# Patient Record
Sex: Female | Born: 1970 | Race: Black or African American | Hispanic: No | Marital: Married | Smoking: Former smoker
Health system: Southern US, Community
[De-identification: ages and names within clinical notes are randomized; demographics above are authoritative.]

## PROBLEM LIST (undated history)

## (undated) DIAGNOSIS — D649 Anemia, unspecified: Secondary | ICD-10-CM

## (undated) DIAGNOSIS — J189 Pneumonia, unspecified organism: Secondary | ICD-10-CM

## (undated) DIAGNOSIS — E119 Type 2 diabetes mellitus without complications: Secondary | ICD-10-CM

---

## 2001-03-29 ENCOUNTER — Emergency Department (HOSPITAL_COMMUNITY): Admission: EM | Admit: 2001-03-29 | Discharge: 2001-03-29 | Payer: Self-pay | Admitting: Emergency Medicine

## 2005-12-26 ENCOUNTER — Emergency Department: Payer: Self-pay | Admitting: Emergency Medicine

## 2007-01-13 ENCOUNTER — Ambulatory Visit: Payer: Self-pay | Admitting: Family Medicine

## 2007-01-22 ENCOUNTER — Ambulatory Visit: Payer: Self-pay | Admitting: Family Medicine

## 2007-01-22 LAB — CONVERTED CEMR LAB
ALT: 21 units/L (ref 0–40)
AST: 26 units/L (ref 0–37)
Alkaline Phosphatase: 89 units/L (ref 39–117)
BUN: 10 mg/dL (ref 6–23)
Calcium: 8.8 mg/dL (ref 8.4–10.5)
Cholesterol: 186 mg/dL (ref 0–200)
Creatinine,U: 133.8 mg/dL
GFR calc Af Amer: 145 mL/min
GFR calc non Af Amer: 120 mL/min
Glucose, Bld: 309 mg/dL — ABNORMAL HIGH (ref 70–99)
Hgb A1c MFr Bld: 12 % — ABNORMAL HIGH (ref 4.6–6.0)
Potassium: 3.7 meq/L (ref 3.5–5.1)
Total Protein: 6.3 g/dL (ref 6.0–8.3)

## 2007-01-23 ENCOUNTER — Encounter: Payer: Self-pay | Admitting: Family Medicine

## 2007-02-13 ENCOUNTER — Encounter: Payer: Self-pay | Admitting: Family Medicine

## 2007-02-14 ENCOUNTER — Encounter: Payer: Self-pay | Admitting: Family Medicine

## 2007-02-14 LAB — CONVERTED CEMR LAB: Pap Smear: NORMAL

## 2007-02-16 ENCOUNTER — Ambulatory Visit: Payer: Self-pay | Admitting: Family Medicine

## 2007-03-02 ENCOUNTER — Encounter: Payer: Self-pay | Admitting: Family Medicine

## 2007-03-02 ENCOUNTER — Other Ambulatory Visit: Admission: RE | Admit: 2007-03-02 | Discharge: 2007-03-02 | Payer: Self-pay | Admitting: Family Medicine

## 2007-03-02 ENCOUNTER — Ambulatory Visit: Payer: Self-pay | Admitting: Family Medicine

## 2007-03-26 ENCOUNTER — Emergency Department: Payer: Self-pay | Admitting: Emergency Medicine

## 2007-10-14 ENCOUNTER — Encounter: Payer: Self-pay | Admitting: Family Medicine

## 2007-11-23 ENCOUNTER — Ambulatory Visit: Payer: Self-pay | Admitting: Family Medicine

## 2007-11-23 DIAGNOSIS — B373 Candidiasis of vulva and vagina: Secondary | ICD-10-CM

## 2007-11-23 DIAGNOSIS — L538 Other specified erythematous conditions: Secondary | ICD-10-CM | POA: Insufficient documentation

## 2007-11-23 LAB — CONVERTED CEMR LAB
Bilirubin Urine: NEGATIVE
Ketones, urine, test strip: NEGATIVE
Nitrite: NEGATIVE
Specific Gravity, Urine: 1.015
Urobilinogen, UA: NEGATIVE

## 2007-11-24 DIAGNOSIS — E119 Type 2 diabetes mellitus without complications: Secondary | ICD-10-CM

## 2007-12-21 ENCOUNTER — Encounter (INDEPENDENT_AMBULATORY_CARE_PROVIDER_SITE_OTHER): Payer: Self-pay | Admitting: *Deleted

## 2007-12-23 ENCOUNTER — Encounter (INDEPENDENT_AMBULATORY_CARE_PROVIDER_SITE_OTHER): Payer: Self-pay | Admitting: *Deleted

## 2008-03-31 ENCOUNTER — Ambulatory Visit: Payer: Self-pay | Admitting: Family Medicine

## 2008-03-31 DIAGNOSIS — N3000 Acute cystitis without hematuria: Secondary | ICD-10-CM | POA: Insufficient documentation

## 2008-03-31 LAB — CONVERTED CEMR LAB
Bilirubin Urine: NEGATIVE
Glucose, Urine, Semiquant: >=1000
Urobilinogen, UA: NEGATIVE

## 2008-04-01 ENCOUNTER — Encounter: Payer: Self-pay | Admitting: Family Medicine

## 2008-04-01 DIAGNOSIS — E669 Obesity, unspecified: Secondary | ICD-10-CM | POA: Insufficient documentation

## 2008-04-01 DIAGNOSIS — R87619 Unspecified abnormal cytological findings in specimens from cervix uteri: Secondary | ICD-10-CM

## 2008-04-01 DIAGNOSIS — A599 Trichomoniasis, unspecified: Secondary | ICD-10-CM

## 2009-07-01 ENCOUNTER — Emergency Department: Payer: Self-pay | Admitting: Emergency Medicine

## 2010-05-18 ENCOUNTER — Emergency Department: Payer: Self-pay | Admitting: Emergency Medicine

## 2010-10-09 ENCOUNTER — Ambulatory Visit: Payer: Self-pay | Admitting: Family Medicine

## 2011-05-03 NOTE — Assessment & Plan Note (Signed)
[facility]                           [facility] OFFICE NOTE   NAME:[name], [name] CREASON                       MRN:          [id number]  DATE:01/13/2007                            DOB:          1971/02/25    CHIEF COMPLAINT:  A 40 year old black female here to establish new  doctor.   HISTORY OF PRESENT ILLNESS:  Ms. [name] [name] has not seen a primary care  doctor since she saw Dr. Welton Flakes 2-3 years ago.  She has the following  concerns:  1. Diabetes, poor control:  She states that she has not had this      evaluated but did have some blood sugar spillage in her urine when      she [facility].  She has      been having increased thirst and fatigue.  She states that she eats      very well on a diabetic diet but drinks lots of soda and Kool-Aid      and knows that that is her major issue.  She does not measure her      blood sugar at home.  She does not get regular exercise.  2. Vaginal itching, chronic:  She states that she notices intermittent      worsening of vaginal itching off and on when she eats lots of      starches.  She states that is recurrent, several times a month.      She has used some over-the-counter Monistat recently which helped a      small amount but did not make it go away completely.  She denies      copious vaginal discharge.  She occasionally does have some redness      and irritation under her breasts.   PAST MEDICAL HISTORY:  1. Diabetes.  2. Abnormal Pap.  3. Trichomonas.   HOSPITALIZATIONS, SURGERIES, PROCEDURES:  1. In 1992, C-section.  2. Cryotherapy.  3. Pap smear in 2006 negative.   ALLERGIES:  None.   MEDICATIONS:  None.   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  She does not smoke.  She rarely drinks alcohol, about one time every few  months.  She does not use drugs.  She is single but is currently in a  longterm relationship and does not use  condoms.  She has one child who  is healthy.  She walks at work but does not get regular exercise.  She  eats three meals a day.  She eats lot of vegetables, lean meats, and  does not like starches so avoids them.  She also does not eat frequent  fast food.  She does drink a lot of Kool-Aid and soda throughout the  day, as well as a lot of water.   FAMILY HISTORY:  Father deceased at age 27 from MI and diabetes.  Mother  alive at age 71 with diabetes, on dialysis with kidney failure, as well  as blindness.  She has one half-brother and two  half-sisters who are  healthy.  She has an aunt who developed breast cancer at age 53 or 43,  but no other family history of cancer.   PHYSICAL EXAMINATION:  VITAL SIGNS:  Height 67-and-a-quarter inches,  weight 290, making BMI 39.  Blood pressure 126/84, pulse 84, temperature  98.3.  GENERAL:  Obese-appearing female in no apparent distress.  HEENT:  PERRLA, extraocular muscles intact.  Oropharynx clear, tympanic  membranes clear, nares clear.  No thyromegaly.  No lymphadenopathy  supraclavicular or cervical.  CARDIOVASCULAR:  Regular rate and rhythm.  No murmurs, rubs or gallops.  PULMONARY:  Clear to auscultation bilaterally.  No wheezes, rales, or  rhonchi.  ABDOMEN:  Soft, nontender, normoactive bowel sounds, no  hepatosplenomegaly.  MUSCULOSKELETAL:  Strength 5/5 in upper and lower extremities.  NEUROLOGIC:  Alert and oriented x3.  Cranial nerves II-XII grossly  intact.  GENITOURINARY:  Dry, irritated vaginal vault with dried discharge.  No  cervical motion tenderness.   PROCEDURE:  Wet prep:  Positive clue cells, no Trichomonas, no yeast.   ASSESSMENT AND PLAN:  1. Diabetes, unclear or poor control:  We will begin by checking      hemoglobin A1c, CMET, cholesterol, and microalbumin.  A lot of her      symptoms are most likely secondary to poorly controlled diabetes.      Depending on her hemoglobin A1c, we will have her work on diet and       exercise and lifestyle changes versus adding a medication.  She was      set up for diabetes and nutrition referral today.  She was also      given lots of information on lifestyle change and complications of      diabetes.  2. Bacterial vaginosis:  Will treat with Flagyl 500 mg p.o. b.i.d. x7      days.  This has likely been a repeated problem along with possible      yeast infections because of poorly controlled diabetes.  3. Prevention:  As stated above, I will get a cholesterol panel.  She      is up-to-date with vaccines.  She is due for a mammogram, given her      aunt with breast cancer at age 34.  She will return for Pap smear.      I will obtain records from her previous doctor.     Kerby Nora, MD  Electronically Signed    AB/MedQ  DD: 01/13/2007  DT: 01/13/2007  Job #: [id number]

## 2017-03-05 ENCOUNTER — Ambulatory Visit: Payer: Self-pay | Attending: Oncology

## 2018-02-25 ENCOUNTER — Emergency Department: Payer: No Typology Code available for payment source

## 2018-02-25 ENCOUNTER — Emergency Department
Admission: EM | Admit: 2018-02-25 | Discharge: 2018-02-25 | Disposition: A | Discharge status: Home / Self Care | Payer: No Typology Code available for payment source | Attending: Emergency Medicine | Admitting: Emergency Medicine

## 2018-02-25 ENCOUNTER — Encounter: Payer: Self-pay | Admitting: Emergency Medicine

## 2018-02-25 DIAGNOSIS — M7918 Myalgia, other site: Secondary | ICD-10-CM

## 2018-02-25 DIAGNOSIS — Y92410 Unspecified street and highway as the place of occurrence of the external cause: Secondary | ICD-10-CM | POA: Insufficient documentation

## 2018-02-25 DIAGNOSIS — S42142A Displaced fracture of glenoid cavity of scapula, left shoulder, initial encounter for closed fracture: Secondary | ICD-10-CM | POA: Diagnosis not present

## 2018-02-25 DIAGNOSIS — E119 Type 2 diabetes mellitus without complications: Secondary | ICD-10-CM | POA: Insufficient documentation

## 2018-02-25 DIAGNOSIS — Y999 Unspecified external cause status: Secondary | ICD-10-CM | POA: Insufficient documentation

## 2018-02-25 DIAGNOSIS — Y9389 Activity, other specified: Secondary | ICD-10-CM | POA: Diagnosis not present

## 2018-02-25 DIAGNOSIS — S4992XA Unspecified injury of left shoulder and upper arm, initial encounter: Secondary | ICD-10-CM | POA: Diagnosis present

## 2018-02-25 DIAGNOSIS — S42152A Displaced fracture of neck of scapula, left shoulder, initial encounter for closed fracture: Secondary | ICD-10-CM | POA: Insufficient documentation

## 2018-02-25 HISTORY — DX: Type 2 diabetes mellitus without complications: E11.9

## 2018-02-25 LAB — POCT PREGNANCY, URINE: PREG TEST UR: NEGATIVE

## 2018-02-25 MED ORDER — KETOROLAC TROMETHAMINE 10 MG PO TABS: 10.0000 mg | ORAL_TABLET | Freq: Three times a day (TID) | ORAL | 0 refills | Status: DC

## 2018-02-25 MED ORDER — ORPHENADRINE CITRATE 30 MG/ML IJ SOLN
60.0000 mg | INTRAMUSCULAR | Status: AC
  Administered 2018-02-25: 60 mg via INTRAMUSCULAR
  Filled 2018-02-25: qty 2

## 2018-02-25 MED ORDER — KETOROLAC TROMETHAMINE 30 MG/ML IJ SOLN
30.0000 mg | Freq: Once | INTRAMUSCULAR | Status: AC
  Administered 2018-02-25: 30 mg via INTRAMUSCULAR
  Filled 2018-02-25: qty 1

## 2018-02-25 MED ORDER — ONDANSETRON 4 MG PO TBDP
4.0000 mg | ORAL_TABLET | Freq: Once | ORAL | Status: AC
  Administered 2018-02-25: 4 mg via ORAL
  Filled 2018-02-25: qty 1

## 2018-02-25 MED ORDER — OXYCODONE-ACETAMINOPHEN 5-325 MG PO TABS
1.0000 | ORAL_TABLET | Freq: Once | ORAL | Status: AC
  Administered 2018-02-25: 1 via ORAL
  Filled 2018-02-25: qty 1

## 2018-02-25 MED ORDER — CYCLOBENZAPRINE HCL 5 MG PO TABS: 5.0000 mg | ORAL_TABLET | Freq: Three times a day (TID) | ORAL | 0 refills | Status: DC | PRN

## 2018-02-25 MED ORDER — OXYCODONE-ACETAMINOPHEN 7.5-325 MG PO TABS: 1.0000 | ORAL_TABLET | Freq: Three times a day (TID) | ORAL | 0 refills | Status: AC | PRN
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
## 2018-03-16 ENCOUNTER — Ambulatory Visit: Payer: Self-pay | Attending: Oncology

## 2018-07-30 ENCOUNTER — Other Ambulatory Visit: Payer: Self-pay | Admitting: Surgery

## 2018-07-30 DIAGNOSIS — M5412 Radiculopathy, cervical region: Secondary | ICD-10-CM

## 2018-08-06 ENCOUNTER — Other Ambulatory Visit: Payer: Self-pay | Admitting: Surgery

## 2018-08-06 ENCOUNTER — Ambulatory Visit: Payer: No Typology Code available for payment source

## 2018-08-06 DIAGNOSIS — S46011S Strain of muscle(s) and tendon(s) of the rotator cuff of right shoulder, sequela: Secondary | ICD-10-CM

## 2018-08-06 DIAGNOSIS — M7581 Other shoulder lesions, right shoulder: Secondary | ICD-10-CM

## 2018-08-06 DIAGNOSIS — M5412 Radiculopathy, cervical region: Secondary | ICD-10-CM

## 2018-09-15 ENCOUNTER — Encounter
Admission: RE | Admit: 2018-09-15 | Discharge: 2018-09-15 | Disposition: A | Discharge status: Home / Self Care | Payer: No Typology Code available for payment source | Source: Ambulatory Visit | Attending: Surgery | Admitting: Surgery

## 2018-09-15 ENCOUNTER — Other Ambulatory Visit: Payer: Self-pay

## 2018-09-15 HISTORY — DX: Pneumonia, unspecified organism: J18.9

## 2018-09-15 HISTORY — DX: Anemia, unspecified: D64.9

## 2018-09-15 NOTE — Patient Instructions (Addendum)
Your procedure is scheduled on: 09-22-18 TUESDAY Report to Same Day Surgery 2nd floor medical mall [facility] Entrance-take elevator on left to 2nd floor.  Check in with surgery information desk.) To find out your arrival time please call [phone] between 1PM - 3PM on 09-21-18 MONDAY  Remember: Instructions that are not followed completely may result in serious medical risk, up to and including death, or upon the discretion of your surgeon and anesthesiologist your surgery may need to be rescheduled.    _x___ 1. Do not eat food after midnight the night before your procedure. NO GUM OR CANDY AFTER MIDNIGHT.  You may drink WATER up to 2 hours before you are scheduled to arrive at the hospital for your procedure.  Do not drink WATER within 2 hours of your scheduled arrival to the hospital.  Type 1 and type 2 diabetics should only drink water.   ____Ensure clear carbohydrate drink on the way to the hospital for bariatric patients  ____Ensure clear carbohydrate drink 3 hours before surgery for Dr Rutherford Nail patients if physician instructed.    __x__ 2. No Alcohol for 24 hours before or after surgery.   __x__3. No Smoking or e-cigarettes for 24 prior to surgery.  Do not use any chewable tobacco products for at least 6 hour prior to surgery   ____  4. Bring all medications with you on the day of surgery if instructed.    __x__ 5. Notify your doctor if there is any change in your medical condition     (cold, fever, infections).    x___6. On the morning of surgery brush your teeth with toothpaste and water.  You may rinse your mouth with mouth wash if you wish.  Do not swallow any toothpaste or mouthwash.   Do not wear jewelry, make-up, hairpins, clips or nail polish.  Do not wear lotions, powders, or perfumes. You may wear deodorant.  Do not shave 48 hours prior to surgery. Men may shave face and neck.  Do not bring valuables to the hospital.    [facility] is not responsible for any  belongings or valuables.               Contacts, dentures or bridgework may not be worn into surgery.  Leave your suitcase in the car. After surgery it may be brought to your room.  For patients admitted to the hospital, discharge time is determined by your treatment team.  _  Patients discharged the day of surgery will not be allowed to drive home.  You will need someone to drive you home and stay with you the night of your procedure.    Please read over the following fact sheets that you were given:   [facility] Preparing for Surgery  ____ Take anti-hypertensive listed below, cardiac, seizure, asthma, anti-reflux and psychiatric medicines. These include:  1. NONE  2.  3.  4.  5.  6.  ____Fleets enema or Magnesium Citrate as directed.   _x___ Use CHG Soap or sage wipes as directed on instruction sheet   ____ Use inhalers on the day of surgery and bring to hospital day of surgery  _X___ Stop Metformin 2 days prior to surgery-LAST DOSE ON Saturday, OCTOBER 5TH     _X___ Take 1/2 of usual insulin dose the night before surgery and none on the morning surgery-TAKE HALF OF YOUR LEVEMIR ON Monday NIGHT AND NO INSULIN AM OF SURGERY   ____ Follow recommendations from Cardiologist, Pulmonologist or PCP regarding  stopping Aspirin, Coumadin, Plavix ,Eliquis, Effient, or Pradaxa, and Pletal.  X____Stop Anti-inflammatories such as Advil, Aleve, Ibuprofen, Motrin, Naproxen, NABUMETONE (RELAFEN) Naprosyn, Goodies powders or aspirin products NOW-OK to take Tylenol    ____ Stop supplements until after surgery.    ____ Bring C-Pap to the hospital.

## 2018-09-16 ENCOUNTER — Encounter
Admission: RE | Admit: 2018-09-16 | Discharge: 2018-09-16 | Disposition: A | Discharge status: Home / Self Care | Payer: No Typology Code available for payment source | Source: Ambulatory Visit | Attending: Surgery | Admitting: Surgery

## 2018-09-16 DIAGNOSIS — Z01818 Encounter for other preprocedural examination: Secondary | ICD-10-CM | POA: Insufficient documentation

## 2018-09-16 DIAGNOSIS — E119 Type 2 diabetes mellitus without complications: Secondary | ICD-10-CM | POA: Insufficient documentation

## 2018-09-16 DIAGNOSIS — Z01810 Encounter for preprocedural cardiovascular examination: Secondary | ICD-10-CM | POA: Diagnosis not present

## 2018-09-16 LAB — HEMOGLOBIN: Hemoglobin: 12.2 g/dL (ref 12.0–16.0)

## 2018-09-16 LAB — BASIC METABOLIC PANEL
Anion gap: 10 (ref 5–15)
BUN: 16 mg/dL (ref 6–20)
CHLORIDE: 104 mmol/L (ref 98–111)
CO2: 25 mmol/L (ref 22–32)
Calcium: 9.5 mg/dL (ref 8.9–10.3)
Creatinine, Ser: 0.68 mg/dL (ref 0.44–1.00)
GFR calc Af Amer: >60 mL/min (ref >60)
GFR calc non Af Amer: >60 mL/min (ref >60)
GLUCOSE: 187 mg/dL — AB (ref 70–99)
POTASSIUM: 4.3 mmol/L (ref 3.5–5.1)
Sodium: 139 mmol/L (ref 135–145)

## 2018-09-16 NOTE — Pre-Procedure Instructions (Signed)
AS INSTRUCTED BY DR P CARROLL, EKG/REQUEST FOR CLEARANCE CALLED AND FAXED TO [facility] AT [facility]. ALSO FYI FAXED TO DR Joice Lofts

## 2018-09-21 MED ORDER — DEXTROSE 5 % IV SOLN
3.0000 g | Freq: Once | INTRAVENOUS | Status: DC
  Filled 2018-09-21: qty 3000

## 2018-09-21 NOTE — Pre-Procedure Instructions (Signed)
CALLED OVER TO [facility] TO CHECK ON STATUS OF MEDICAL CLEARANCE-PT IS COMING IN AT 2:40 TODAY TO PCP OFFICE FOR HER CLEARANCE SINCE PT HAS NOT BEEN SEEN IN OVER A YEAR

## 2018-09-22 ENCOUNTER — Emergency Department: Payer: No Typology Code available for payment source

## 2018-09-22 ENCOUNTER — Encounter
Admission: RE | Disposition: A | Discharge status: Home / Self Care | Payer: Self-pay | Source: Ambulatory Visit | Attending: Surgery

## 2018-09-22 ENCOUNTER — Encounter: Payer: Self-pay | Admitting: *Deleted

## 2018-09-22 ENCOUNTER — Other Ambulatory Visit: Payer: Self-pay

## 2018-09-22 ENCOUNTER — Ambulatory Visit: Payer: No Typology Code available for payment source | Admitting: Anesthesiology

## 2018-09-22 ENCOUNTER — Ambulatory Visit
Admission: RE | Admit: 2018-09-22 | Discharge: 2018-09-22 | Disposition: A | Discharge status: Home / Self Care | Payer: No Typology Code available for payment source | Source: Ambulatory Visit | Attending: Surgery | Admitting: Surgery

## 2018-09-22 ENCOUNTER — Emergency Department
Admission: EM | Admit: 2018-09-22 | Discharge: 2018-09-23 | Disposition: A | Discharge status: Home / Self Care | Payer: No Typology Code available for payment source | Source: Home / Self Care | Attending: Student in an Organized Health Care Education/Training Program | Admitting: Student in an Organized Health Care Education/Training Program

## 2018-09-22 DIAGNOSIS — Z87891 Personal history of nicotine dependence: Secondary | ICD-10-CM | POA: Insufficient documentation

## 2018-09-22 DIAGNOSIS — R0602 Shortness of breath: Secondary | ICD-10-CM | POA: Insufficient documentation

## 2018-09-22 DIAGNOSIS — E119 Type 2 diabetes mellitus without complications: Secondary | ICD-10-CM | POA: Diagnosis not present

## 2018-09-22 DIAGNOSIS — Z79899 Other long term (current) drug therapy: Secondary | ICD-10-CM | POA: Insufficient documentation

## 2018-09-22 DIAGNOSIS — M75111 Incomplete rotator cuff tear or rupture of right shoulder, not specified as traumatic: Secondary | ICD-10-CM | POA: Diagnosis present

## 2018-09-22 DIAGNOSIS — Z794 Long term (current) use of insulin: Secondary | ICD-10-CM

## 2018-09-22 DIAGNOSIS — M7521 Bicipital tendinitis, right shoulder: Secondary | ICD-10-CM | POA: Diagnosis not present

## 2018-09-22 DIAGNOSIS — Z7984 Long term (current) use of oral hypoglycemic drugs: Secondary | ICD-10-CM | POA: Diagnosis not present

## 2018-09-22 HISTORY — PX: SHOULDER ARTHROSCOPY WITH OPEN ROTATOR CUFF REPAIR: SHX6092

## 2018-09-22 LAB — GLUCOSE, CAPILLARY
GLUCOSE-CAPILLARY: 239 mg/dL — AB (ref 70–99)
Glucose-Capillary: 242 mg/dL — ABNORMAL HIGH (ref 70–99)
Glucose-Capillary: 250 mg/dL — ABNORMAL HIGH (ref 70–99)
Glucose-Capillary: 221 mg/dL — ABNORMAL HIGH (ref 70–99)

## 2018-09-22 LAB — CBC WITH DIFFERENTIAL/PLATELET
ABS IMMATURE GRANULOCYTES: 0.07 10*3/uL (ref 0.00–0.07)
BASOS ABS: 0 10*3/uL (ref 0.0–0.1)
Basophils Relative: 0 %
Eosinophils Absolute: 0 10*3/uL (ref 0.0–0.5)
Eosinophils Relative: 0 %
HCT: 38.8 % (ref 36.0–46.0)
HEMOGLOBIN: 12.3 g/dL (ref 12.0–15.0)
IMMATURE GRANULOCYTES: 1 %
LYMPHS PCT: 6 %
Lymphs Abs: 0.8 10*3/uL (ref 0.7–4.0)
MCH: 27 pg (ref 26.0–34.0)
MCHC: 31.7 g/dL (ref 30.0–36.0)
MCV: 85.3 fL (ref 80.0–100.0)
MONO ABS: 0.2 10*3/uL (ref 0.1–1.0)
Monocytes Relative: 1 %
NEUTROS ABS: 12.7 10*3/uL — AB (ref 1.7–7.7)
NRBC: 0 % (ref 0.0–0.2)
Neutrophils Relative %: 92 %
Platelets: 288 10*3/uL (ref 150–400)
RBC: 4.55 MIL/uL (ref 3.87–5.11)
RDW: 13.3 % (ref 11.5–15.5)
WBC: 13.7 10*3/uL — AB (ref 4.0–10.5)

## 2018-09-22 LAB — POCT PREGNANCY, URINE: Preg Test, Ur: NEGATIVE

## 2018-09-22 LAB — COMPREHENSIVE METABOLIC PANEL
ALBUMIN: 4 g/dL (ref 3.5–5.0)
ALK PHOS: 95 U/L (ref 38–126)
ALT: 38 U/L (ref 0–44)
AST: 39 U/L (ref 15–41)
Anion gap: 11 (ref 5–15)
BUN: 13 mg/dL (ref 6–20)
CALCIUM: 9 mg/dL (ref 8.9–10.3)
CO2: 24 mmol/L (ref 22–32)
CREATININE: 0.97 mg/dL (ref 0.44–1.00)
Chloride: 101 mmol/L (ref 98–111)
GFR calc Af Amer: >60 mL/min (ref >60)
GFR calc non Af Amer: >60 mL/min (ref >60)
GLUCOSE: 401 mg/dL — AB (ref 70–99)
Potassium: 4.3 mmol/L (ref 3.5–5.1)
SODIUM: 136 mmol/L (ref 135–145)
Total Bilirubin: 0.5 mg/dL (ref 0.3–1.2)
Total Protein: 7.9 g/dL (ref 6.5–8.1)

## 2018-09-22 LAB — URINE DRUG SCREEN, QUALITATIVE (ARMC ONLY)
Amphetamines, Ur Screen: NOT DETECTED
Barbiturates, Ur Screen: NOT DETECTED
Benzodiazepine, Ur Scrn: NOT DETECTED
Cannabinoid 50 Ng, Ur ~~LOC~~: NOT DETECTED
Cocaine Metabolite,Ur ~~LOC~~: NOT DETECTED
MDMA (Ecstasy)Ur Screen: NOT DETECTED
Methadone Scn, Ur: NOT DETECTED
Opiate, Ur Screen: NOT DETECTED
Phencyclidine (PCP) Ur S: NOT DETECTED
Tricyclic, Ur Screen: NOT DETECTED

## 2018-09-22 LAB — TROPONIN I: Troponin I: <0.03 ng/mL (ref <0.03)

## 2018-09-22 SURGERY — ARTHROSCOPY, SHOULDER WITH REPAIR, ROTATOR CUFF, OPEN
Anesthesia: General | Site: Shoulder | Laterality: Right

## 2018-09-22 MED ORDER — DEXAMETHASONE SODIUM PHOSPHATE 4 MG/ML IJ SOLN
INTRAMUSCULAR | Status: DC | PRN
  Administered 2018-09-22: 6 mg via INTRAVENOUS

## 2018-09-22 MED ORDER — ROCURONIUM BROMIDE 50 MG/5ML IV SOLN
INTRAVENOUS | Status: AC
  Filled 2018-09-22: qty 2

## 2018-09-22 MED ORDER — MIDAZOLAM HCL 2 MG/2ML IJ SOLN
INTRAMUSCULAR | Status: AC
  Administered 2018-09-22: 2 mg via INTRAVENOUS
  Filled 2018-09-22: qty 2

## 2018-09-22 MED ORDER — BUPIVACAINE LIPOSOME 1.3 % IJ SUSP
INTRAMUSCULAR | Status: AC
  Filled 2018-09-22: qty 20

## 2018-09-22 MED ORDER — FENTANYL CITRATE (PF) 250 MCG/5ML IJ SOLN
INTRAMUSCULAR | Status: AC
  Filled 2018-09-22: qty 5

## 2018-09-22 MED ORDER — PROPOFOL 10 MG/ML IV BOLUS
INTRAVENOUS | Status: AC
  Filled 2018-09-22: qty 20

## 2018-09-22 MED ORDER — LIDOCAINE HCL (CARDIAC) PF 100 MG/5ML IV SOSY
PREFILLED_SYRINGE | INTRAVENOUS | Status: DC | PRN
  Administered 2018-09-22 (×2): 100 mg via INTRAVENOUS

## 2018-09-22 MED ORDER — FENTANYL CITRATE (PF) 100 MCG/2ML IJ SOLN
INTRAMUSCULAR | Status: DC | PRN
  Administered 2018-09-22: 50 ug via INTRAVENOUS
  Administered 2018-09-22 (×2): 100 ug via INTRAVENOUS

## 2018-09-22 MED ORDER — PROPOFOL 10 MG/ML IV BOLUS
INTRAVENOUS | Status: DC | PRN
  Administered 2018-09-22: 200 mg via INTRAVENOUS

## 2018-09-22 MED ORDER — MIDAZOLAM HCL 2 MG/2ML IJ SOLN
2.0000 mg | Freq: Once | INTRAMUSCULAR | Status: AC
  Administered 2018-09-22: 2 mg via INTRAVENOUS

## 2018-09-22 MED ORDER — FENTANYL CITRATE (PF) 100 MCG/2ML IJ SOLN: 25.0000 ug | INTRAMUSCULAR | Status: DC | PRN

## 2018-09-22 MED ORDER — INSULIN ASPART 100 UNIT/ML ~~LOC~~ SOLN
4.0000 [IU] | Freq: Once | SUBCUTANEOUS | Status: AC
  Administered 2018-09-22: 4 [IU] via SUBCUTANEOUS

## 2018-09-22 MED ORDER — INSULIN ASPART 100 UNIT/ML ~~LOC~~ SOLN
SUBCUTANEOUS | Status: AC
  Administered 2018-09-22: 4 [IU] via SUBCUTANEOUS
  Filled 2018-09-22: qty 1

## 2018-09-22 MED ORDER — FENTANYL CITRATE (PF) 100 MCG/2ML IJ SOLN
INTRAMUSCULAR | Status: AC
  Administered 2018-09-22: 25 ug via INTRAVENOUS
  Filled 2018-09-22: qty 2

## 2018-09-22 MED ORDER — OXYCODONE HCL 5 MG/5ML PO SOLN: 5.0000 mg | Freq: Once | ORAL | Status: DC | PRN

## 2018-09-22 MED ORDER — FENTANYL CITRATE (PF) 100 MCG/2ML IJ SOLN
100.0000 ug | Freq: Once | INTRAMUSCULAR | Status: AC
  Administered 2018-09-22: 25 ug via INTRAVENOUS

## 2018-09-22 MED ORDER — INSULIN ASPART 100 UNIT/ML ~~LOC~~ SOLN
SUBCUTANEOUS | Status: AC
  Filled 2018-09-22: qty 1

## 2018-09-22 MED ORDER — SODIUM CHLORIDE 0.9 % IV SOLN
INTRAVENOUS | Status: DC
  Administered 2018-09-22: 10:00:00 via INTRAVENOUS

## 2018-09-22 MED ORDER — FAMOTIDINE 20 MG PO TABS
ORAL_TABLET | ORAL | Status: AC
  Administered 2018-09-22: 20 mg via ORAL
  Filled 2018-09-22: qty 1

## 2018-09-22 MED ORDER — LIDOCAINE HCL (PF) 1 % IJ SOLN
INTRAMUSCULAR | Status: AC
  Filled 2018-09-22: qty 5

## 2018-09-22 MED ORDER — IOPAMIDOL (ISOVUE-370) INJECTION 76%
100.0000 mL | Freq: Once | INTRAVENOUS | Status: AC | PRN
  Administered 2018-09-22: 100 mL via INTRAVENOUS

## 2018-09-22 MED ORDER — PROMETHAZINE HCL 25 MG/ML IJ SOLN: 6.2500 mg | INTRAMUSCULAR | Status: DC | PRN

## 2018-09-22 MED ORDER — LIDOCAINE HCL (PF) 2 % IJ SOLN
INTRAMUSCULAR | Status: AC
  Filled 2018-09-22: qty 10

## 2018-09-22 MED ORDER — ROCURONIUM BROMIDE 100 MG/10ML IV SOLN
INTRAVENOUS | Status: DC | PRN
  Administered 2018-09-22: 80 mg via INTRAVENOUS

## 2018-09-22 MED ORDER — FAMOTIDINE 20 MG PO TABS
20.0000 mg | ORAL_TABLET | Freq: Once | ORAL | Status: AC
  Administered 2018-09-22: 20 mg via ORAL

## 2018-09-22 MED ORDER — SUGAMMADEX SODIUM 200 MG/2ML IV SOLN
INTRAVENOUS | Status: DC | PRN
  Administered 2018-09-22: 200 mg via INTRAVENOUS

## 2018-09-22 MED ORDER — INSULIN ASPART 100 UNIT/ML ~~LOC~~ SOLN
5.0000 [IU] | Freq: Once | SUBCUTANEOUS | Status: AC
  Administered 2018-09-22: 5 [IU] via SUBCUTANEOUS

## 2018-09-22 MED ORDER — ONDANSETRON HCL 4 MG/2ML IJ SOLN
INTRAMUSCULAR | Status: AC
  Filled 2018-09-22: qty 2

## 2018-09-22 MED ORDER — ONDANSETRON HCL 4 MG/2ML IJ SOLN
INTRAMUSCULAR | Status: DC | PRN
  Administered 2018-09-22: 4 mg via INTRAVENOUS

## 2018-09-22 MED ORDER — PHENYLEPHRINE HCL 10 MG/ML IJ SOLN
INTRAVENOUS | Status: DC | PRN
  Administered 2018-09-22: 30 ug/min via INTRAVENOUS

## 2018-09-22 MED ORDER — LORAZEPAM 0.5 MG PO TABS
0.5000 mg | ORAL_TABLET | Freq: Once | ORAL | Status: AC
  Administered 2018-09-22: 0.5 mg via ORAL
  Filled 2018-09-22: qty 1

## 2018-09-22 MED ORDER — OXYCODONE HCL 5 MG PO TABS: 5.0000 mg | ORAL_TABLET | ORAL | 0 refills | Status: AC | PRN

## 2018-09-22 MED ORDER — FENTANYL CITRATE (PF) 100 MCG/2ML IJ SOLN
25.0000 ug | Freq: Once | INTRAMUSCULAR | Status: AC
  Administered 2018-09-22: 25 ug via INTRAVENOUS

## 2018-09-22 MED ORDER — BUPIVACAINE HCL (PF) 0.5 % IJ SOLN
INTRAMUSCULAR | Status: AC
  Filled 2018-09-22: qty 10

## 2018-09-22 MED ORDER — OXYCODONE HCL 5 MG PO TABS: 5.0000 mg | ORAL_TABLET | Freq: Once | ORAL | Status: DC | PRN

## 2018-09-22 SURGICAL SUPPLY — 47 items
ANCH SUT 2 2.9 2 LD TPR NDL (Anchor) ×1 IMPLANT
ANCH SUT RGNRT REGENETEN (Staple) ×1 IMPLANT
ANCHOR JUGGERKNOT WTAP NDL 2.9 (Anchor) ×2 IMPLANT
ANCHOR TENDON REGENETEN (Staple) ×2 IMPLANT
ANCHORS BONE REGENETEN (Anchor) ×2 IMPLANT
BIT DRILL JUGRKNT W/NDL BIT2.9 (DRILL) IMPLANT
BLADE FULL RADIUS 3.5 (BLADE) ×3 IMPLANT
BUR ACROMIONIZER 4.0 (BURR) ×3 IMPLANT
CANNULA SHAVER 8MMX76MM (CANNULA) ×3 IMPLANT
CHLORAPREP W/TINT 26ML (MISCELLANEOUS) ×3 IMPLANT
COVER MAYO STAND STRL (DRAPES) ×3 IMPLANT
DRAPE IMP U-DRAPE 54X76 (DRAPES) ×6 IMPLANT
DRILL JUGGERKNOT W/NDL BIT 2.9 (DRILL) ×3
ELECT REM PT RETURN 9FT ADLT (ELECTROSURGICAL) ×3
ELECTRODE REM PT RTRN 9FT ADLT (ELECTROSURGICAL) ×1 IMPLANT
GAUZE 4X4 16PLY RFD (DISPOSABLE) ×3 IMPLANT
GAUZE PETRO XEROFOAM 1X8 (MISCELLANEOUS) ×3 IMPLANT
GLOVE BIO SURGEON STRL SZ7.5 (GLOVE) ×6 IMPLANT
GLOVE BIO SURGEON STRL SZ8 (GLOVE) ×6 IMPLANT
GLOVE BIOGEL PI IND STRL 8 (GLOVE) ×1 IMPLANT
GLOVE BIOGEL PI INDICATOR 8 (GLOVE) ×2
GLOVE INDICATOR 8.0 STRL GRN (GLOVE) ×3 IMPLANT
GOWN STRL REUS W/ TWL LRG LVL3 (GOWN DISPOSABLE) ×1 IMPLANT
GOWN STRL REUS W/ TWL XL LVL3 (GOWN DISPOSABLE) ×1 IMPLANT
GOWN STRL REUS W/TWL LRG LVL3 (GOWN DISPOSABLE) ×3
GOWN STRL REUS W/TWL XL LVL3 (GOWN DISPOSABLE) ×3
GRASPER SUT 15 45D LOW PRO (SUTURE) IMPLANT
IMPLANT REGENETEN MEDIUM (Shoulder) ×2 IMPLANT
IV LACTATED RINGER IRRG 3000ML (IV SOLUTION) ×6
IV LR IRRIG 3000ML ARTHROMATIC (IV SOLUTION) ×2 IMPLANT
MANIFOLD NEPTUNE II (INSTRUMENTS) ×3 IMPLANT
MASK FACE SPIDER DISP (MASK) ×3 IMPLANT
MAT ABSORB  FLUID 56X50 GRAY (MISCELLANEOUS) ×2
MAT ABSORB FLUID 56X50 GRAY (MISCELLANEOUS) ×1 IMPLANT
PACK ARTHROSCOPY SHOULDER (MISCELLANEOUS) ×3 IMPLANT
SLING ARM LRG DEEP (SOFTGOODS) ×3 IMPLANT
SLING ULTRA II LG (MISCELLANEOUS) ×3 IMPLANT
STAPLER SKIN PROX 35W (STAPLE) ×3 IMPLANT
STRAP SAFETY 5IN WIDE (MISCELLANEOUS) ×3 IMPLANT
SUT ETHIBOND 0 MO6 C/R (SUTURE) ×3 IMPLANT
SUT VIC AB 2-0 CT1 27 (SUTURE) ×6
SUT VIC AB 2-0 CT1 TAPERPNT 27 (SUTURE) ×2 IMPLANT
TAPE MICROFOAM 4IN (TAPE) ×3 IMPLANT
TUBING ARTHRO INFLOW-ONLY STRL (TUBING) ×3 IMPLANT
TUBING CONNECTING 10 (TUBING) ×2 IMPLANT
TUBING CONNECTING 10' (TUBING) ×1
WAND HAND CNTRL MULTIVAC 90 (MISCELLANEOUS) ×3 IMPLANT

## 2018-09-22 NOTE — ED Provider Notes (Signed)
[facility] Emergency Department Provider Note    First MD Initiated Contact with Patient 09/22/18 2148     (approximate)  I have reviewed the triage vital signs and the nursing notes.   HISTORY  Chief Complaint Anxiety and Shortness of Breath    HPI [name] [name] is a 47 y.o. female presents with chief complaint of  chest pain shortness of breath that occurred suddenly while patient was at home.  Patient had right shoulder surgery today while sitting at home and started developing shortness of breath as if she could not take a breath.  Had to go outside to breathe.  Still having some chest discomfort shortness of breath.  Has any history of heart attack or blood clots.   Past Medical History:  Diagnosis Date  . Anemia    H/O  . Diabetes mellitus without complication (HCC)   . Pneumonia    H/O   History reviewed. No pertinent family history. Past Surgical History:  Procedure Laterality Date  . CESAREAN SECTION     Patient Active Problem List   Diagnosis Date Noted  . 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 OBESITY 04/01/2008  . PAP SMEAR, ABNORMAL 04/01/2008  . CYSTITIS, ACUTE 03/31/2008  . DIABETES MELLITUS, TYPE II 11/24/2007  . VAGINITIS, CANDIDAL 11/23/2007  . INTERTRIGO, CANDIDAL 11/23/2007      Prior to Admission medications   Medication Sig Start Date End Date Taking? Authorizing Provider  gabapentin (NEURONTIN) 300 MG capsule Take 300 mg by mouth daily as needed for pain. 07/31/18   [provider]  glipiZIDE (GLUCOTROL XL) 10 MG 24 hr tablet Take 10 mg by mouth 2 (two) times daily.    [provider]  insulin detemir (LEVEMIR) 100 UNIT/ML injection Inject 15 Units into the skin at bedtime.    [provider]  linagliptin (TRADJENTA) 5 MG TABS tablet Take 5 mg by mouth daily.    [provider]  metFORMIN (GLUCOPHAGE) 1000 MG tablet Take 1,000 mg by mouth 2 (two) times daily with a meal.    [provider]  nabumetone (RELAFEN) 500 MG tablet Take 500 mg by mouth 2 (two) times daily. 08/18/18   [provider]  oxyCODONE (ROXICODONE) 5 MG immediate release tablet Take 1-2 tablets (5-10 mg total) by mouth every 4 (four) hours as needed for moderate pain. 09/22/18   Poggi, Excell Seltzer, MD    Allergies Latex    Social History Social History   Tobacco Use  . Smoking status: Former Smoker    Years: 4.00    Types: Cigarettes    Last attempt to quit: 09/15/1998    Years since quitting: 20.0  . Smokeless tobacco: Never Used  . Tobacco comment: SOCIALLY ONLY  Substance Use Topics  . Alcohol use: No    Frequency: Never  . Drug use: Yes    Types: Marijuana    Review of Systems Patient denies headaches, rhinorrhea, blurry vision, numbness, shortness of breath, chest pain, edema, cough, abdominal pain, nausea, vomiting, diarrhea, dysuria, fevers, rashes or hallucinations unless otherwise stated above in HPI. ____________________________________________   PHYSICAL EXAM:  VITAL SIGNS: Vitals:   09/22/18 2215 09/22/18 2300  BP: (!) 160/97 (!) 161/79  Pulse: (!) 106 (!) 102  Resp: 18 20  Temp:    SpO2: 99% 99%    Constitutional: Alert and oriented.  Eyes: Conjunctivae are normal.  Head: Atraumatic. Nose: No congestion/rhinnorhea. Mouth/Throat: Mucous membranes are moist.   Neck: No stridor. Painless ROM.  Cardiovascular: Normal rate, regular rhythm. Grossly normal heart sounds.  Good peripheral circulation. Respiratory: Normal respiratory effort.  No retractions. Lungs CTAB. Gastrointestinal: Soft and nontender. No distention. No abdominal bruits. No CVA tenderness. Genitourinary:  Musculoskeletal: No lower extremity tenderness nor edema.  No joint effusions. Neurologic:  Normal speech and language. No gross focal neurologic deficits are appreciated. No facial droop Skin:  Skin is warm, dry and intact. No rash noted. Psychiatric: Mood and affect are normal. Speech  and behavior are normal.  ____________________________________________   LABS (all labs ordered are listed, but only abnormal results are displayed)  Results for orders placed or performed during the hospital encounter of 09/22/18 (from the past 24 hour(s))  CBC with Differential/Platelet     Status: Abnormal   Collection Time: 09/22/18  9:15 PM  Result Value Ref Range   WBC 13.7 (H) 4.0 - 10.5 K/uL   RBC 4.55 3.87 - 5.11 MIL/uL   Hemoglobin 12.3 12.0 - 15.0 g/dL   HCT 16.1 09.6 - 04.5 %   MCV 85.3 80.0 - 100.0 fL   MCH 27.0 26.0 - 34.0 pg   MCHC 31.7 30.0 - 36.0 g/dL   RDW 40.9 81.1 - 91.4 %   Platelets 288 150 - 400 K/uL   nRBC 0.0 0.0 - 0.2 %   Neutrophils Relative % 92 %   Neutro Abs 12.7 (H) 1.7 - 7.7 K/uL   Lymphocytes Relative 6 %   Lymphs Abs 0.8 0.7 - 4.0 K/uL   Monocytes Relative 1 %   Monocytes Absolute 0.2 0.1 - 1.0 K/uL   Eosinophils Relative 0 %   Eosinophils Absolute 0.0 0.0 - 0.5 K/uL   Basophils Relative 0 %   Basophils Absolute 0.0 0.0 - 0.1 K/uL   Immature Granulocytes 1 %   Abs Immature Granulocytes 0.07 0.00 - 0.07 K/uL  Comprehensive metabolic panel     Status: Abnormal   Collection Time: 09/22/18  9:15 PM  Result Value Ref Range   Sodium 136 135 - 145 mmol/L   Potassium 4.3 3.5 - 5.1 mmol/L   Chloride 101 98 - 111 mmol/L   CO2 24 22 - 32 mmol/L   Glucose, Bld 401 (H) 70 - 99 mg/dL   BUN 13 6 - 20 mg/dL   Creatinine, Ser 7.82 0.44 - 1.00 mg/dL   Calcium 9.0 8.9 - 95.6 mg/dL   Total Protein 7.9 6.5 - 8.1 g/dL   Albumin 4.0 3.5 - 5.0 g/dL   AST 39 15 - 41 U/L   ALT 38 0 - 44 U/L   Alkaline Phosphatase 95 38 - 126 U/L   Total Bilirubin 0.5 0.3 - 1.2 mg/dL   GFR calc non Af Amer >60 >60 mL/min   GFR calc Af Amer >60 >60 mL/min   Anion gap 11 5 - 15  Troponin I     Status: None   Collection Time: 09/22/18  9:15 PM  Result Value Ref Range   Troponin I <0.03 <0.03 ng/mL   ____________________________________________  EKG My review and  personal interpretation at Time: 21:16   Indication: chest pain  Rate: 98  Rhythm: sinus Axis: normal Other: normal intervals, no stemi ____________________________________________  RADIOLOGY  I personally reviewed all radiographic images ordered to evaluate for the above acute complaints and reviewed radiology reports and findings.  These findings were personally discussed with the patient.  Please see medical record for radiology report.  ____________________________________________   PROCEDURES  Procedure(s) performed:  Procedures  Critical Care performed: no ____________________________________________   INITIAL IMPRESSION / ASSESSMENT AND PLAN / ED COURSE  Pertinent labs & imaging results that were available during my care of the patient were reviewed by me and considered in my medical decision making (see chart for details).   DDX: Asthma, copd, CHF, pna, ptx, malignancy, Pe, anemia   [name] [name] is a 47 y.o. who presents to the ED with symptoms as described above.  Patient mildly tachycardic but no hypoxia.  No wheezing on exam no evidence of pneumonia.  Blood will be sent for the above differential.  Seems less likely ACS EKG shows no evidence of acute ischemia.  Clinical Course as of Sep 23 2339  Tue Sep 22, 2018  2328 Still having some discomfort.  Given the persistent shortness of breath and mild tachycardia status post surgery will order CT angiogram.   [PR]    Clinical Course User Index [PR] Willy Eddy, MD     As part of my medical decision making, I reviewed the following data within the electronic MEDICAL RECORD NUMBER Nursing notes reviewed and incorporated, Labs reviewed, notes from prior ED visits and [place] Controlled Substance Database   ____________________________________________   FINAL CLINICAL IMPRESSION(S) / ED DIAGNOSES  Final diagnoses:  Shortness of breath      NEW MEDICATIONS STARTED DURING THIS VISIT:  New Prescriptions   No  medications on file     Note:  This document was prepared using Dragon voice recognition software and may include unintentional dictation errors.    Willy Eddy, MD 09/22/18 2340

## 2018-09-22 NOTE — Anesthesia Preprocedure Evaluation (Addendum)
Anesthesia Evaluation  Patient identified by MRN, date of birth, ID band Patient awake    Reviewed: Allergy & Precautions, H&P , NPO status , Patient's Chart, lab work & pertinent test results  Airway Mallampati: II  TM Distance: >3 FB Neck ROM: full    Dental  (+) Teeth Intact, Missing   Pulmonary former smoker,    breath sounds clear to auscultation       Cardiovascular negative cardio ROS   Rhythm:regular Rate:Normal     Neuro/Psych negative neurological ROS  negative psych ROS   GI/Hepatic negative GI ROS, Neg liver ROS,   Endo/Other  diabetesMorbid obesity  Renal/GU negative Renal ROS     Musculoskeletal   Abdominal   Peds  Hematology  (+) Blood dyscrasia, anemia ,   Anesthesia Other Findings Past Medical History: No date: Anemia     Comment:  H/O No date: Diabetes mellitus without complication (HCC) No date: Pneumonia     Comment:  H/O  Past Surgical History: No date: CESAREAN SECTION     Reproductive/Obstetrics negative OB ROS                            Anesthesia Physical Anesthesia Plan  ASA: III  Anesthesia Plan: General ETT   Post-op Pain Management:    Induction:   PONV Risk Score and Plan: Ondansetron and Dexamethasone  Airway Management Planned:   Additional Equipment:   Intra-op Plan:   Post-operative Plan:   Informed Consent: I have reviewed the patients History and Physical, chart, labs and discussed the procedure including the risks, benefits and alternatives for the proposed anesthesia with the patient or authorized representative who has indicated his/her understanding and acceptance.   Dental Advisory Given  Plan Discussed with: Anesthesiologist, CRNA and Surgeon  Anesthesia Plan Comments:        Anesthesia Quick Evaluation

## 2018-09-22 NOTE — Transfer of Care (Signed)
Immediate Anesthesia Transfer of Care Note  Patient: [name] [name]  Procedure(s) Performed: SHOULDER ARTHROSCOPY  with decompression, debridement,  WITH OPEN ROTATOR CUFF REPAIR and biceps tenodesis (Right Shoulder)  Patient Location: PACU  Anesthesia Type:General  Level of Consciousness: awake, alert , oriented and patient cooperative  Airway & Oxygen Therapy: Patient Spontanous Breathing and Patient connected to nasal cannula oxygen  Post-op Assessment: Report given to RN and Post -op Vital signs reviewed and stable  Post vital signs: Reviewed and stable  Last Vitals:  Vitals Value Taken Time  BP 133/63 09/22/2018  1:05 PM  Temp 35.9 C 09/22/2018  1:05 PM  Pulse 89 09/22/2018  1:07 PM  Resp 20 09/22/2018  1:07 PM  SpO2 100 % 09/22/2018  1:07 PM  Vitals shown include unvalidated device data.  Last Pain:  Vitals:   09/22/18 1030  TempSrc:   PainSc: 0-No pain         Complications: No apparent anesthesia complications

## 2018-09-22 NOTE — ED Triage Notes (Signed)
Pt arrived via [facility] from home with complaint of anxiety/chest tightness and shortness of breath. EMS states pt had a rotator cuff surgery today and has been feeling SHOB and chest tightness all afternoon. EMS states that pt had to be intubated for rotator cuff surgery and is feeling her throat hurt due to the surgery. BS per EMS 463. EMS states pt did not take her insulin.

## 2018-09-22 NOTE — Anesthesia Procedure Notes (Signed)
Anesthesia Regional Block: Interscalene brachial plexus block   Pre-Anesthetic Checklist: ,, timeout performed, Correct Patient, Correct Site, Correct Laterality, Correct Procedure, Correct Position, site marked, Risks and benefits discussed, Surgical consent,  Pre-op evaluation,  Post-op pain management  Laterality: Right  Prep: chloraprep       Needles:  Injection technique: Single-shot      Additional Needles:   Narrative:  Start time: 09/22/2018 10:00 AM End time: 09/22/2018 10:10 AM Injection made incrementally with aspirations every 5 mL.  Performed by: Personally  Anesthesiologist: Jovita Gamma, MD  Additional Notes: 20 mL Exparel  10 mL 0.5% bupivacaine

## 2018-09-22 NOTE — H&P (Signed)
Paper H&P to be scanned into permanent record. H&P reviewed and patient re-examined. No changes. 
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
## 2018-09-22 NOTE — Anesthesia Procedure Notes (Signed)
Procedure Name: Intubation Performed by: Bernardo Heater, CRNA Pre-anesthesia Checklist: Patient identified, Emergency Drugs available, Suction available and Patient being monitored Patient Re-evaluated:Patient Re-evaluated prior to induction Oxygen Delivery Method: Circle system utilized Preoxygenation: Pre-oxygenation with 100% oxygen Induction Type: IV induction Laryngoscope Size: Mac and 3 Grade View: Grade I Tube size: 7.0 mm Number of attempts: 1 Airway Equipment and Method: Patient positioned with wedge pillow Placement Confirmation: ETT inserted through vocal cords under direct vision,  positive ETCO2 and breath sounds checked- equal and bilateral Secured at: 22 cm Tube secured with: Tape Dental Injury: Teeth and Oropharynx as per pre-operative assessment

## 2018-09-22 NOTE — Anesthesia Post-op Follow-up Note (Signed)
Anesthesia QCDR form completed.        

## 2018-09-22 NOTE — Discharge Instructions (Addendum)
Orthopedic discharge instructions: Keep dressing dry and intact.  May shower after dressing changed on post-op day #4 (Saturday).  Cover staples with Band-Aids after drying off. Apply ice frequently to shoulder. Take ibuprofen 600-800 mg TID with meals for 7-10 days, then as necessary. Take oxycodone as prescribed when needed.  May supplement with ES Tylenol if necessary. Keep shoulder immobilizer on at all times except may remove for bathing purposes. Follow-up in 10-14 days or as scheduled.   AMBULATORY SURGERY  DISCHARGE INSTRUCTIONS   1) The drugs that you were given will stay in your system until tomorrow so for the next 24 hours you should not:  A) Drive an automobile B) Make any legal decisions C) Drink any alcoholic beverage   2) You may resume regular meals tomorrow.  Today it is better to start with liquids and gradually work up to solid foods.  You may eat anything you prefer, but it is better to start with liquids, then soup and crackers, and gradually work up to solid foods.   3) Please notify your doctor immediately if you have any unusual bleeding, trouble breathing, redness and pain at the surgery site, drainage, fever, or pain not relieved by medication.    4) Additional Instructions:    Please contact your physician with any problems or Same Day Surgery at [phone], Monday through Friday 6 am to 4 pm, or [place] [facility] number at [phone].      Interscalene Nerve Block with Exparel  1.  For your surgery you have received an Interscalene Nerve Block with Exparel. 2. Nerve Blocks affect many types of nerves, including nerves that control movement, pain and normal sensation.  You may experience feelings such as numbness, tingling, heaviness, weakness or the inability to move your arm or the feeling or sensation that your arm has "fallen asleep". 3. A nerve block with Exparel can last up to 5 days.  Usually the weakness wears off first.   The tingling and heaviness usually wear off next.  Finally you may start to notice pain.  Keep in mind that this may occur in any order.  Once a nerve block starts to wear off it is usually completely gone within 60 minutes. 4. ISNB may cause mild shortness of breath, a hoarse voice, blurry vision, unequal pupils, or drooping of the face on the same side as the nerve block.  These symptoms will usually resolve with the numbness.  Very rarely the procedure itself can cause mild seizures. 5. If needed, your surgeon will give you a prescription for pain medication.  It will take about 60 minutes for the oral pain medication to become fully effective.  So, it is recommended that you start taking this medication before the nerve block first begins to wear off, or when you first begin to feel discomfort. 6. Take your pain medication only as prescribed.  Pain medication can cause sedation and decrease your breathing if you take more than you need for the level of pain that you have. 7. Nausea is a common side effect of many pain medications.  You may want to eat something before taking your pain medicine to prevent nausea. 8. After an Interscalene nerve block, you cannot feel pain, pressure or extremes in temperature in the effected arm.  Because your arm is numb it is at an increased risk for injury.  To decrease the possibility of injury, please practice the following:  a. While you are awake change the position of   your arm frequently to prevent too much pressure on any one area for prolonged periods of time. b.  If you have a cast or tight dressing, check the color or your fingers every couple of hours.  Call your surgeon with the appearance of any discoloration (white or blue). c. If you are given a sling to wear before you go home, please wear it  at all times until the block has completely worn off.  Do not get up at night without your sling. d. Please contact [facility] or your surgeon if you do not  begin to regain sensation after 7 days from the surgery.  Anesthesia may be contacted by calling the Same Day Surgery Department, Mon. through Fri., 6 am to 4 pm at [phone].   e. If you experience any other problems or concerns, please contact your surgeon's office. f. If you experience severe or prolonged shortness of breath go to the nearest emergency department. 

## 2018-09-23 ENCOUNTER — Encounter: Payer: Self-pay | Admitting: Surgery

## 2018-09-23 LAB — TROPONIN I: Troponin I: <0.03 ng/mL (ref <0.03)

## 2018-09-23 NOTE — ED Provider Notes (Signed)
Care of the patient from Dr. Roxan Hockey at 11:30 PM with recommendation to follow-up on CT angiogram of the chest as well as repeat troponin.  CT chest revealed no evidence of pulmonary emboli repeat troponin negative.  Patient notified of incidental finding of thyroid nodule with recommendation to follow-up with primary care provider.   Darci Current, MD 09/23/18 [phone]

## 2018-09-23 NOTE — Anesthesia Postprocedure Evaluation (Signed)
Anesthesia Post Note  Patient: [name] [name]  Procedure(s) Performed: SHOULDER ARTHROSCOPY  with decompression, debridement,  WITH OPEN ROTATOR CUFF REPAIR and biceps tenodesis (Right Shoulder)  Patient location during evaluation: PACU Anesthesia Type: General Level of consciousness: awake and alert Pain management: pain level controlled Vital Signs Assessment: post-procedure vital signs reviewed and stable Respiratory status: spontaneous breathing, nonlabored ventilation and respiratory function stable Cardiovascular status: blood pressure returned to baseline and stable Postop Assessment: no apparent nausea or vomiting Anesthetic complications: no     Last Vitals:  Vitals:   09/22/18 1514 09/22/18 1530  BP: (!) 148/98 (!) 162/78  Pulse: 84 81  Resp: 18 18  Temp:  36.4 C  SpO2: 100% 100%    Last Pain:  Vitals:   09/23/18 0844  TempSrc:   PainSc: 4                  Jovita Gamma

## 2018-09-24 ENCOUNTER — Emergency Department (HOSPITAL_COMMUNITY)
Admission: EM | Admit: 2018-09-24 | Discharge: 2018-09-24 | Disposition: A | Discharge status: Home / Self Care | Payer: No Typology Code available for payment source | Attending: Emergency Medicine | Admitting: Emergency Medicine

## 2018-09-24 ENCOUNTER — Other Ambulatory Visit: Payer: Self-pay

## 2018-09-24 ENCOUNTER — Encounter (HOSPITAL_COMMUNITY): Payer: Self-pay

## 2018-09-24 DIAGNOSIS — Z91040 Latex allergy status: Secondary | ICD-10-CM | POA: Insufficient documentation

## 2018-09-24 DIAGNOSIS — Z79899 Other long term (current) drug therapy: Secondary | ICD-10-CM | POA: Diagnosis not present

## 2018-09-24 DIAGNOSIS — E119 Type 2 diabetes mellitus without complications: Secondary | ICD-10-CM | POA: Insufficient documentation

## 2018-09-24 DIAGNOSIS — G5622 Lesion of ulnar nerve, left upper limb: Secondary | ICD-10-CM | POA: Diagnosis not present

## 2018-09-24 DIAGNOSIS — R079 Chest pain, unspecified: Secondary | ICD-10-CM | POA: Diagnosis present

## 2018-09-24 LAB — TROPONIN I: Troponin I: <0.03 ng/mL (ref <0.03)

## 2018-09-24 NOTE — ED Triage Notes (Signed)
Per [facility], pt arrives c/o chest pain and shortness of breath x1 day. Pt recently had right rotator cuff surgery at [facility]. Pt also endorses left hand tingling that began at 0600. Pt is alert and oriented. EMS reports EKG unremarkable. Pt reports 8/10 centralized chest tightness.

## 2018-09-24 NOTE — ED Notes (Signed)
Pt complaining of left arm and right leg numbness, MD Kohut notified.

## 2018-09-24 NOTE — ED Provider Notes (Signed)
Patient signed out by Dr. Juleen China pending troponin in evaluation of chest pain: Right chest tightness x 2 days since shoulder surgery Numbness in left hand Has had 1 evaluation for same since surgery (yesterday), and presented for further evaluation tonight because of persistent symptoms.  Pending troponin. EKG unchanged Plan: If troponin is negative, she can be d/ch'd home and f/u with PCP  Troponin is negative tonight. The patient can be discharged home per plan of previous treatment team.    Elpidio Anis, PA-C 09/24/18 2317    Raeford Razor, MD 09/25/18 1139

## 2018-09-24 NOTE — ED Provider Notes (Signed)
[facility] EMERGENCY DEPARTMENT Provider Note   CSN: [id number] Arrival date & time: 09/24/18  1948     History   Chief Complaint Chief Complaint  Patient presents with  . Chest Pain  . Shortness of Breath    HPI [name] [name] is a 47 y.o. female.  HPI   47 year old female with chest pain.  Patient had right shoulder surgery 2 days ago.  She reports that she woke has had persistent chest discomfort since then.  She describes a tightness in the center of her chest.  She has a sensation that she cannot get a deep breath.  She went to [facility] evening of her surgery and was evaluated in the emergency room.  She had a negative work-up including 2 troponins and a CT with angiography of her chest.  She has had persistent symptoms.  Relatively constant.  No appreciable exacerbating relieving factors.  Only new symptoms since she was last evaluated is some numbness in her left hand which began around 6 PM this morning.  Past Medical History:  Diagnosis Date  . Anemia    H/O  . Diabetes mellitus without complication (HCC)   . Pneumonia    H/O    Patient Active Problem List   Diagnosis Date Noted  . 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 OBESITY 04/01/2008  . PAP SMEAR, ABNORMAL 04/01/2008  . CYSTITIS, ACUTE 03/31/2008  . DIABETES MELLITUS, TYPE II 11/24/2007  . VAGINITIS, CANDIDAL 11/23/2007  . INTERTRIGO, CANDIDAL 11/23/2007    Past Surgical History:  Procedure Laterality Date  . CESAREAN SECTION    . SHOULDER ARTHROSCOPY WITH OPEN ROTATOR CUFF REPAIR Right 09/22/2018   Procedure: SHOULDER ARTHROSCOPY  with decompression, debridement,  WITH OPEN ROTATOR CUFF REPAIR and biceps tenodesis;  Surgeon: Christena Flake, MD;  Location: [facility] ORS;  Service: Orthopedics;  Laterality: Right;     OB History   None      Home Medications    Prior to Admission medications   Medication Sig Start Date End Date Taking? Authorizing Provider  ibuprofen (ADVIL,MOTRIN)  800 MG tablet Take 800 mg by mouth every 8 (eight) hours as needed. 09/24/18  Yes [provider]  gabapentin (NEURONTIN) 300 MG capsule Take 300 mg by mouth 2 (two) times daily.  07/31/18   [provider]  glipiZIDE (GLUCOTROL XL) 10 MG 24 hr tablet Take 10 mg by mouth 2 (two) times daily.    [provider]  insulin detemir (LEVEMIR) 100 UNIT/ML injection Inject 15 Units into the skin at bedtime.    [provider]  linagliptin (TRADJENTA) 5 MG TABS tablet Take 5 mg by mouth daily.    [provider]  metFORMIN (GLUCOPHAGE) 1000 MG tablet Take 1,000 mg by mouth 2 (two) times daily with a meal.    [provider]  nabumetone (RELAFEN) 500 MG tablet Take 500 mg by mouth 2 (two) times daily. 08/18/18   [provider]  oxyCODONE (ROXICODONE) 5 MG immediate release tablet Take 1-2 tablets (5-10 mg total) by mouth every 4 (four) hours as needed for moderate pain. 09/22/18   Poggi, Excell Seltzer, MD    Family History History reviewed. No pertinent family history.  Social History Social History   Tobacco Use  . Smoking status: Former Smoker    Years: 4.00    Types: Cigarettes    Last attempt to quit: 09/15/1998    Years since quitting: 20.0  . Smokeless tobacco: Never Used  . Tobacco  comment: SOCIALLY ONLY  Substance Use Topics  . Alcohol use: No    Frequency: Never  . Drug use: Yes    Types: Marijuana     Allergies   Latex   Review of Systems Review of Systems  All systems reviewed and negative, other than as noted in HPI.  Physical Exam Updated Vital Signs BP 123/67   Pulse 85   Temp 98.1 F (36.7 C) (Oral)   Resp 16   Ht 5\' 6"  (1.676 m)   Wt 117.9 kg   SpO2 99%   BMI 41.97 kg/m   Physical Exam  Constitutional: She appears well-developed and well-nourished. No distress.  HENT:  Head: Normocephalic and atraumatic.  Eyes: Conjunctivae are normal. Right eye exhibits no discharge. Left eye exhibits no discharge.    Neck: Neck supple.  Cardiovascular: Normal rate, regular rhythm and normal heart sounds. Exam reveals no gallop and no friction rub.  No murmur heard. Pulmonary/Chest: Effort normal and breath sounds normal. No respiratory distress.  Abdominal: Soft. She exhibits no distension. There is no tenderness.  Musculoskeletal: She exhibits no edema or tenderness.  Right shoulder bandaged.  Right upper extremity in sling. Lower extremities symmetric as compared to each other. No calf tenderness. Negative Homan's. No palpable cords.   Neurological: She is alert.  Decreased sensation light touch over the ulnar aspect of the left hand the little finger and the ulnar aspect of the ring finger.  Intrinsic musculature of the hand is normal.  Skin: Skin is warm and dry.  Psychiatric: She has a normal mood and affect. Her behavior is normal. Thought content normal.  Nursing note and vitals reviewed.    ED Treatments / Results  Labs (all labs ordered are listed, but only abnormal results are displayed) Labs Reviewed  TROPONIN I    EKG None  Radiology Ct Angio Chest Pe W And/or Wo Contrast  Result Date: 09/23/2018 CLINICAL DATA:  Anxiety and chest tightness with shortness of breath EXAM: CT ANGIOGRAPHY CHEST WITH CONTRAST TECHNIQUE: Multidetector CT imaging of the chest was performed using the standard protocol during bolus administration of intravenous contrast. Multiplanar CT image reconstructions and MIPs were obtained to evaluate the vascular anatomy. CONTRAST:  ISOVUE-370 IOPAMIDOL (ISOVUE-370) INJECTION 76% COMPARISON:  Chest radiograph 09/22/2018 FINDINGS: Body habitus reduces diagnostic sensitivity and specificity. Cardiovascular: No filling defect is identified in the pulmonary arterial tree to suggest pulmonary embolus. Sensitivity for subsegmental emboli is reduced due to patient body habitus related factors. No acute aortic findings. Left anterior descending coronary artery  atherosclerosis. Borderline cardiomegaly. Mediastinum/Nodes: 1.2 by 0.9 cm hypodense lesion of the left posterior thyroid gland, image 4/4. No pathologic adenopathy. Lungs/Pleura: Dependent atelectasis in the right lower lobe. Subsegmental atelectasis in the right upper lobe. Upper Abdomen: Unremarkable Musculoskeletal: Thoracic spondylosis. Review of the MIP images confirms the above findings. IMPRESSION: 1. No filling defect is identified in the pulmonary arterial tree to suggest pulmonary embolus. Sensitivity for smaller emboli reduced due to patient body habitus. 2. Left anterior descending coronary artery atherosclerosis. Borderline cardiomegaly. 3. 1.2 by 0.9 cm hypodense lesion of the left posterior thyroid gland. Consider further follow up non emergent evaluation with thyroid ultrasound. If patient is clinically hyperthyroid, consider nuclear medicine thyroid uptake and scan. 4. Dependent atelectasis in the right lower lobe with subsegmental atelectasis in the right upper lobe. Electronically Signed   By: Gaylyn Rong M.D.   On: 09/23/2018 00:45   Dg Chest Portable 1 View  Result Date: 09/22/2018 CLINICAL  DATA:  Anxiety, chest tightness, and shortness of breath. Rotator cuff surgery today. EXAM: PORTABLE CHEST 1 VIEW COMPARISON:  03/26/2007 FINDINGS: Shallow inspiration. Heart size and pulmonary vascularity are normal for technique. No airspace disease or consolidation in the lungs. No blunting of costophrenic angles. No pneumothorax. Mediastinal contours appear intact. Surgical clips over the right shoulder region. Degenerative changes in the spine. IMPRESSION: No active disease. Electronically Signed   By: Burman Nieves M.D.   On: 09/22/2018 22:27    Procedures Procedures (including critical care time)  Medications Ordered in ED Medications - No data to display   Initial Impression / Assessment and Plan / ED Course  I have reviewed the triage vital signs and the nursing  notes.  Pertinent labs & imaging results that were available during my care of the patient were reviewed by me and considered in my medical decision making (see chart for details).     47 year old female with chest pain.  Atypical for ACS.  Her EKG is not significantly changed from 2 days ago nor from October 2.  She had 2 normal troponins on her last ED visit.  CTA without acute pathology.  We will check another troponin today.  Plan discharge if negative.  Numbness in her left hand is consistent with a peripheral ulnar neuropathy.  She has no motor findings.   Final Clinical Impressions(s) / ED Diagnoses   Final diagnoses:  Chest pain, unspecified type  Ulnar neuropathy of left upper extremity    ED Discharge Orders    None       Raeford Razor, MD 09/26/18 0006

## 2018-09-24 NOTE — ED Notes (Signed)
Pt given ice chips

## 2018-10-07 ENCOUNTER — Other Ambulatory Visit: Payer: Self-pay | Admitting: Nurse Practitioner

## 2018-10-07 DIAGNOSIS — Z1231 Encounter for screening mammogram for malignant neoplasm of breast: Secondary | ICD-10-CM

## 2019-11-18 IMAGING — CT CT ANGIO CHEST
2 of 6 series · 18 of 46 positions shown · IV contrast (APPLIED)
Comparison: Chest radiograph 09/22/2018

CLINICAL DATA: Anxiety and chest tightness with shortness of breath

EXAM:
CT ANGIOGRAPHY CHEST WITH CONTRAST
TECHNIQUE: Multidetector CT imaging of the chest was performed using the
standard protocol during bolus administration of intravenous
contrast. Multiplanar CT image reconstructions and MIPs were
obtained to evaluate the vascular anatomy.
CONTRAST:  100mL JJXV5K-1OV IOPAMIDOL (JJXV5K-1OV) INJECTION 76%

[Series 5: thins · axial · 0.66mm/px · z∈[+238,+448]mm · 15 of 230 slices shown]
[im 10/230  lung]
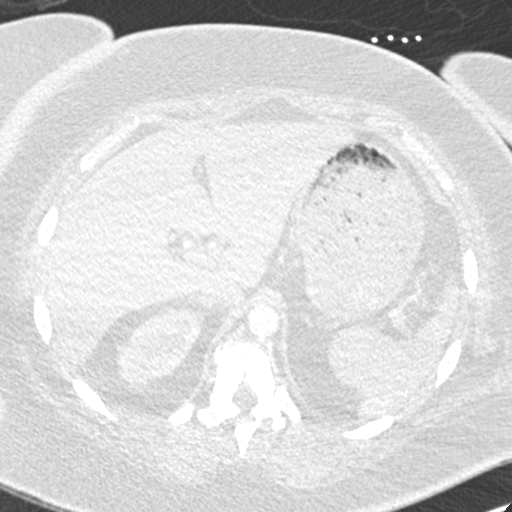
[im 30/230  soft-tissue]
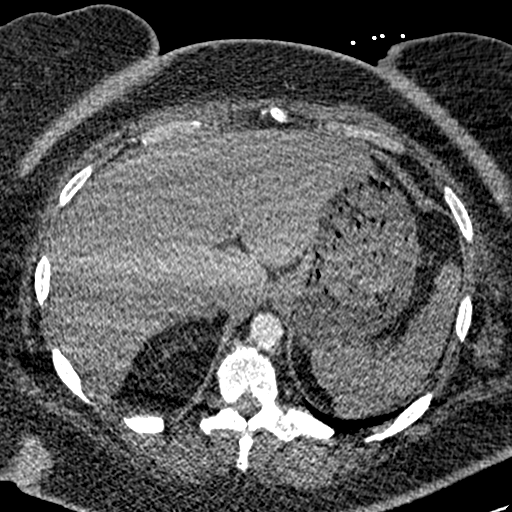
[im 40/230  lung]
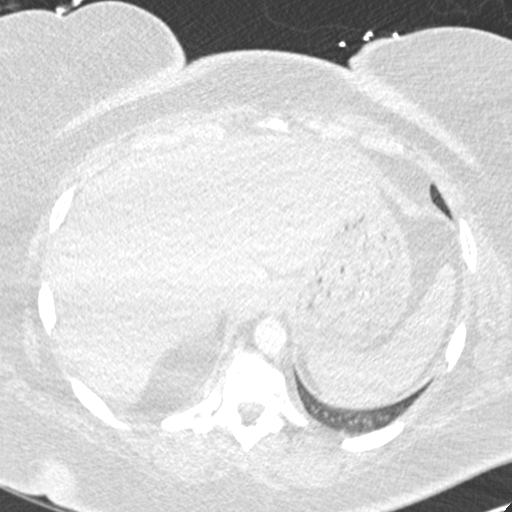
[im 60/230  soft-tissue]
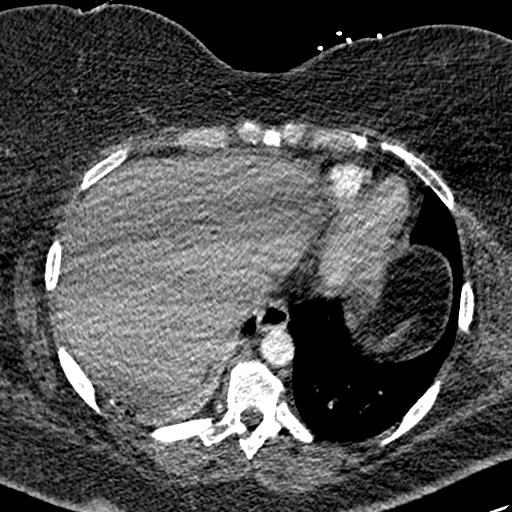
[im 70/230  lung]
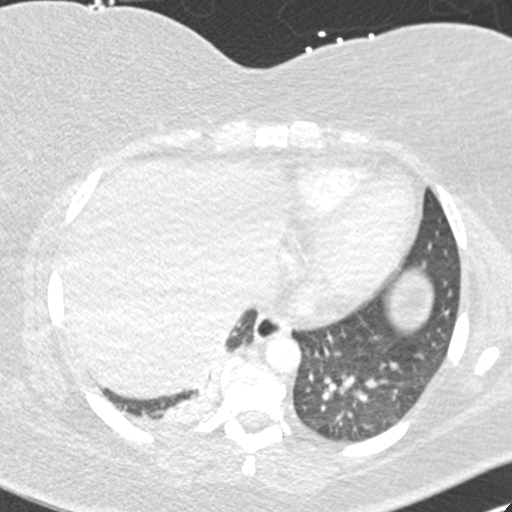
[im 90/230  soft-tissue]
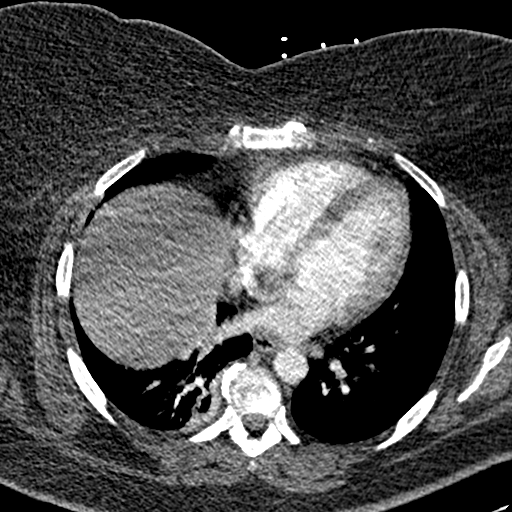
[im 100/230  lung]
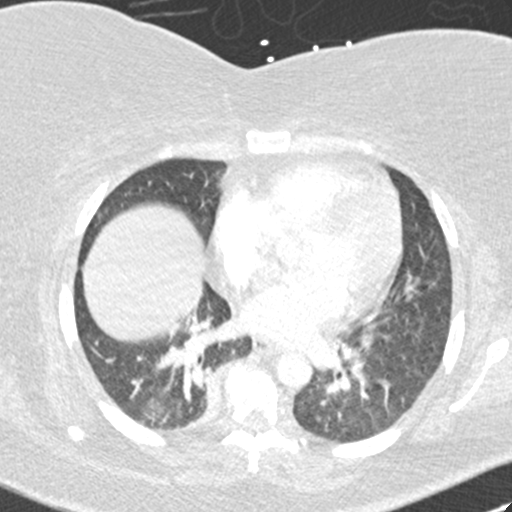
[im 120/230  soft-tissue]
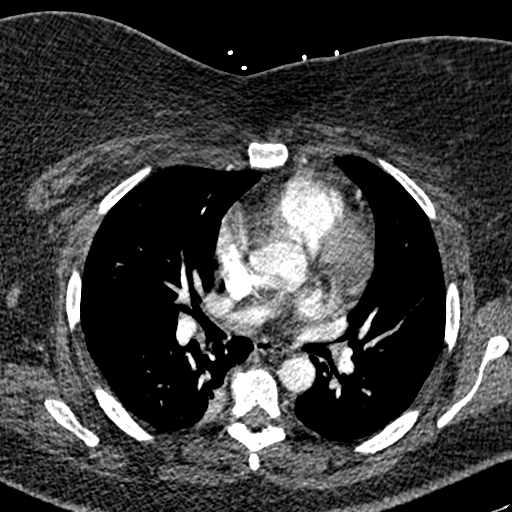
[im 130/230  lung]
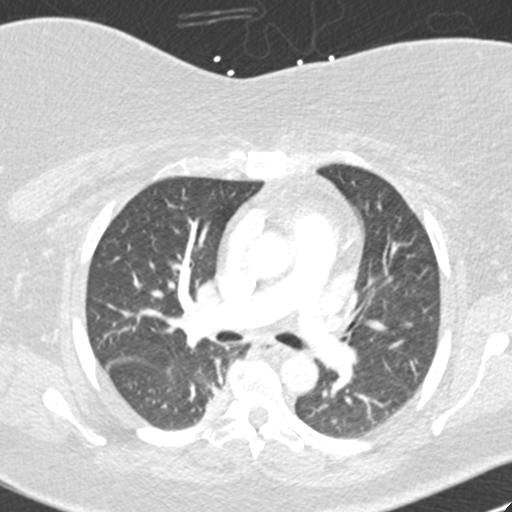
[im 140/230  soft-tissue]
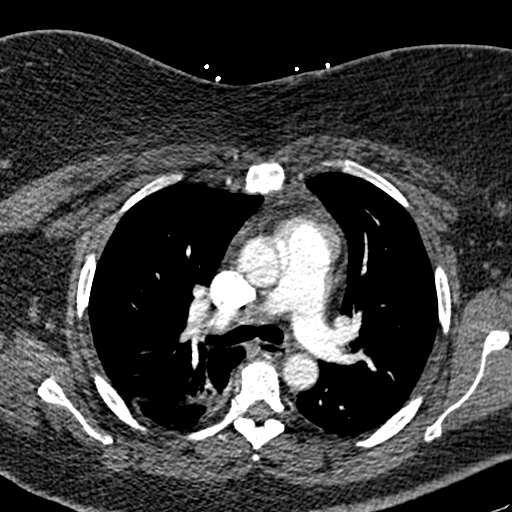
[im 160/230  lung]
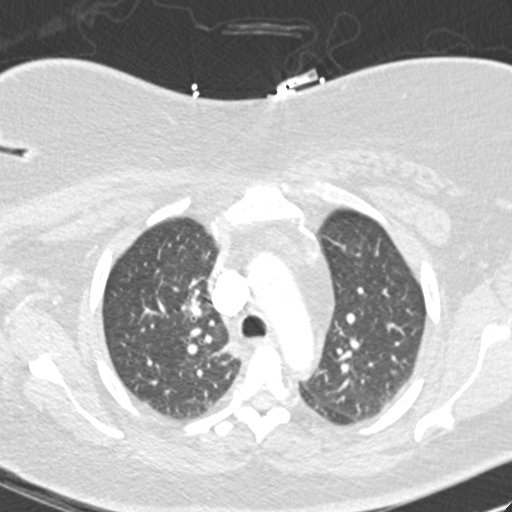
[im 170/230  soft-tissue]
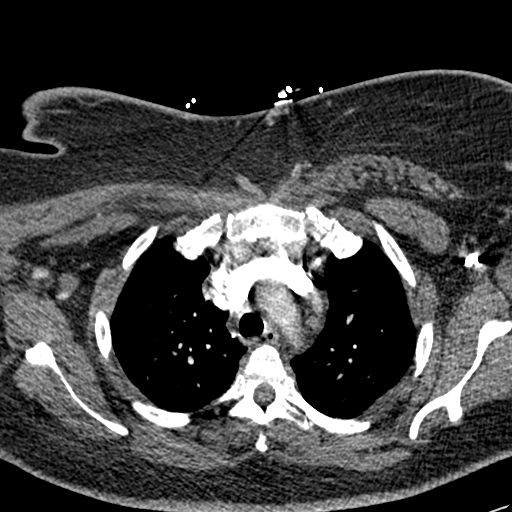
[im 190/230  lung]
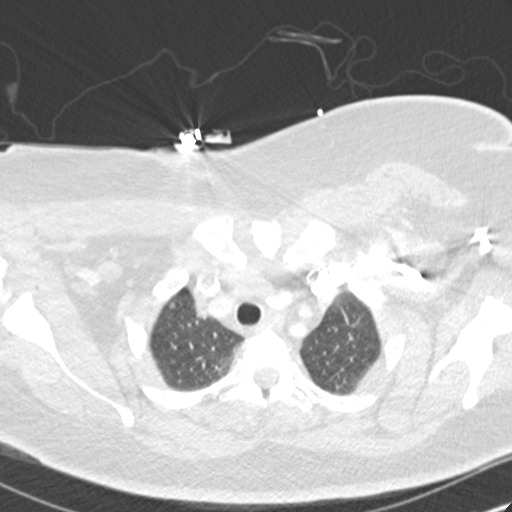
[im 200/230  soft-tissue]
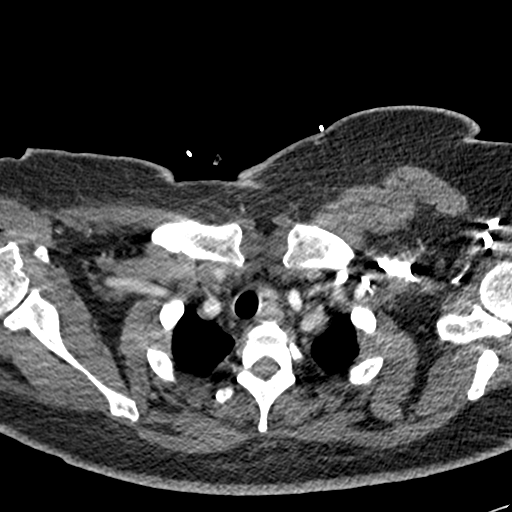
[im 220/230  lung]
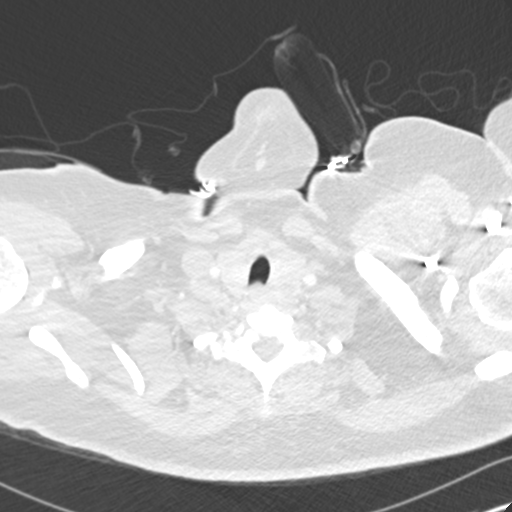

[Series 7: coronal mpr · coronal · 0.49mm/px · 3 of 98 slices shown]
[im 25/98  soft-tissue]
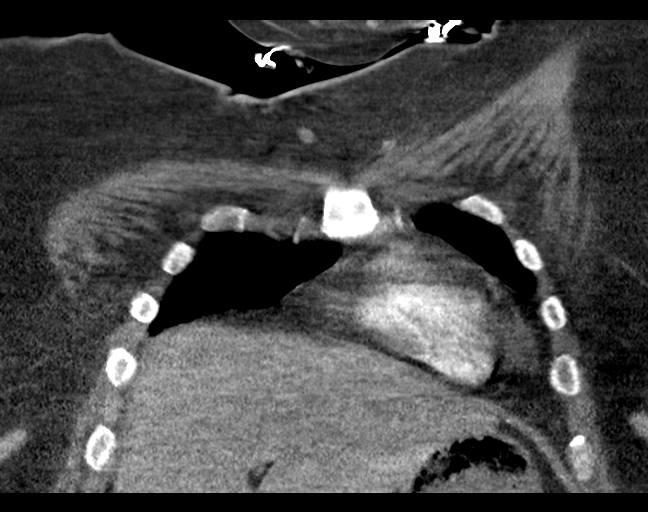
[im 49/98  soft-tissue]
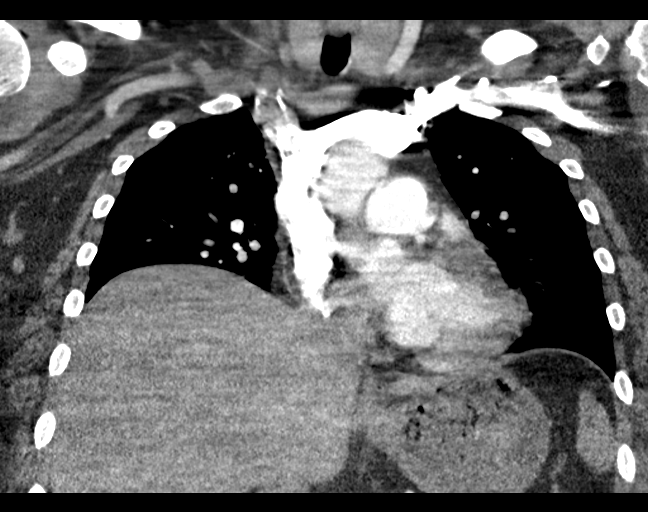
[im 73/98  soft-tissue]
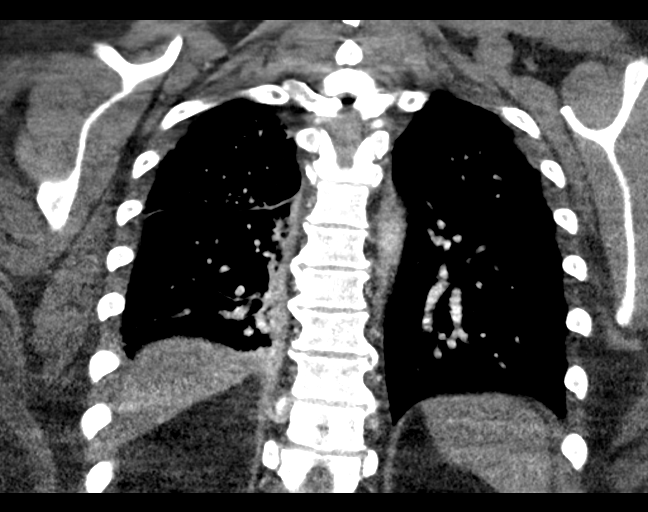

[18 of 46 positions shown; findings below may reference images not displayed]

FINDINGS: Body habitus reduces diagnostic sensitivity and specificity.

Cardiovascular: No filling defect is identified in the pulmonary
arterial tree to suggest pulmonary embolus. Sensitivity for
subsegmental emboli is reduced due to patient body habitus related
factors.

No acute aortic findings. Left anterior descending coronary artery
atherosclerosis. Borderline cardiomegaly.

Mediastinum/Nodes: 1.2 by 0.9 cm hypodense lesion of the left
posterior thyroid gland, image [DATE].

No pathologic adenopathy.

Lungs/Pleura: Dependent atelectasis in the right lower lobe.
Subsegmental atelectasis in the right upper lobe.

Upper Abdomen: Unremarkable

Musculoskeletal: Thoracic spondylosis.

Review of the MIP images confirms the above findings.
IMPRESSION: 1. No filling defect is identified in the pulmonary arterial tree to
suggest pulmonary embolus. Sensitivity for smaller emboli reduced
due to patient body habitus.
2. Left anterior descending coronary artery atherosclerosis.
Borderline cardiomegaly.
3. 1.2 by 0.9 cm hypodense lesion of the left posterior thyroid
gland. Consider further follow up non emergent evaluation with
thyroid ultrasound. If patient is clinically hyperthyroid, consider
nuclear medicine thyroid uptake and scan.
4. Dependent atelectasis in the right lower lobe with subsegmental
atelectasis in the right upper lobe.

## 2019-11-18 IMAGING — DX DG CHEST 1V PORT
2 series · 2 of 2 positions shown · non-contrast
Comparison: 03/26/2007

CLINICAL DATA: Anxiety, chest tightness, and shortness of breath.
Rotator cuff surgery today.

EXAM:
PORTABLE CHEST 1 VIEW

[chest ap (1 of 2)]
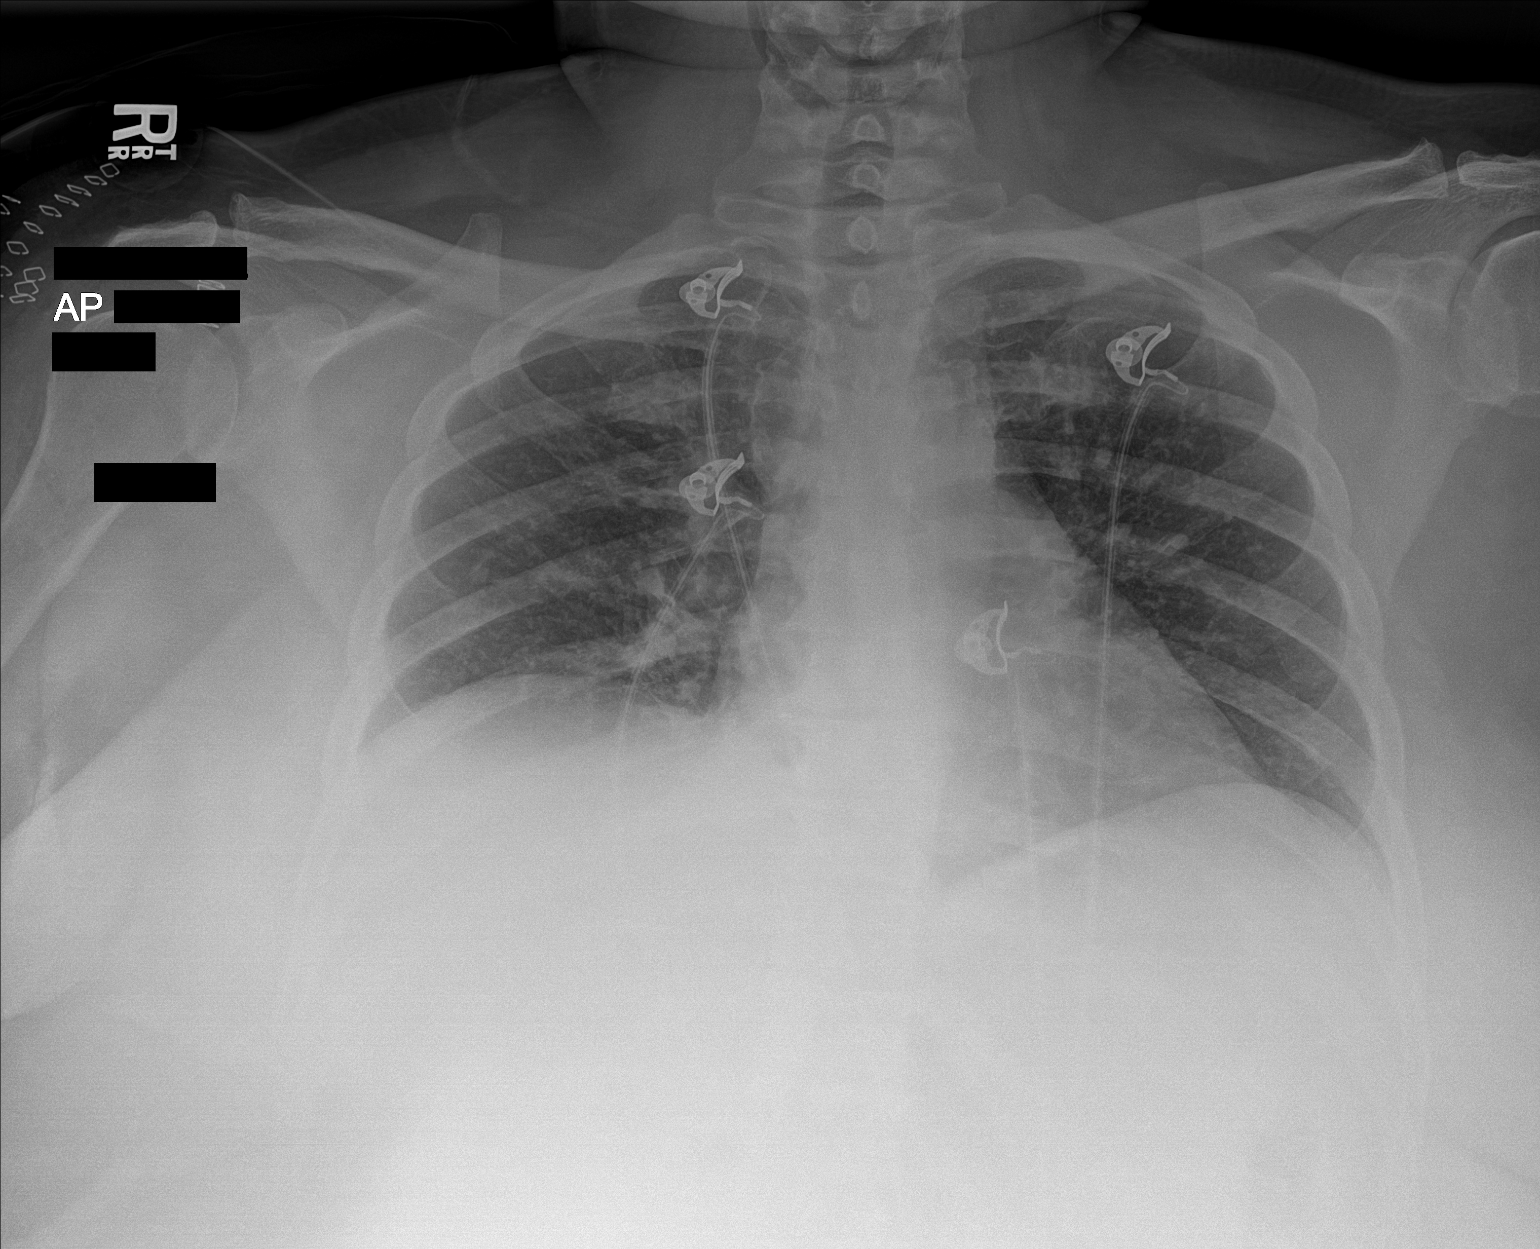

[chest ap (2 of 2)]
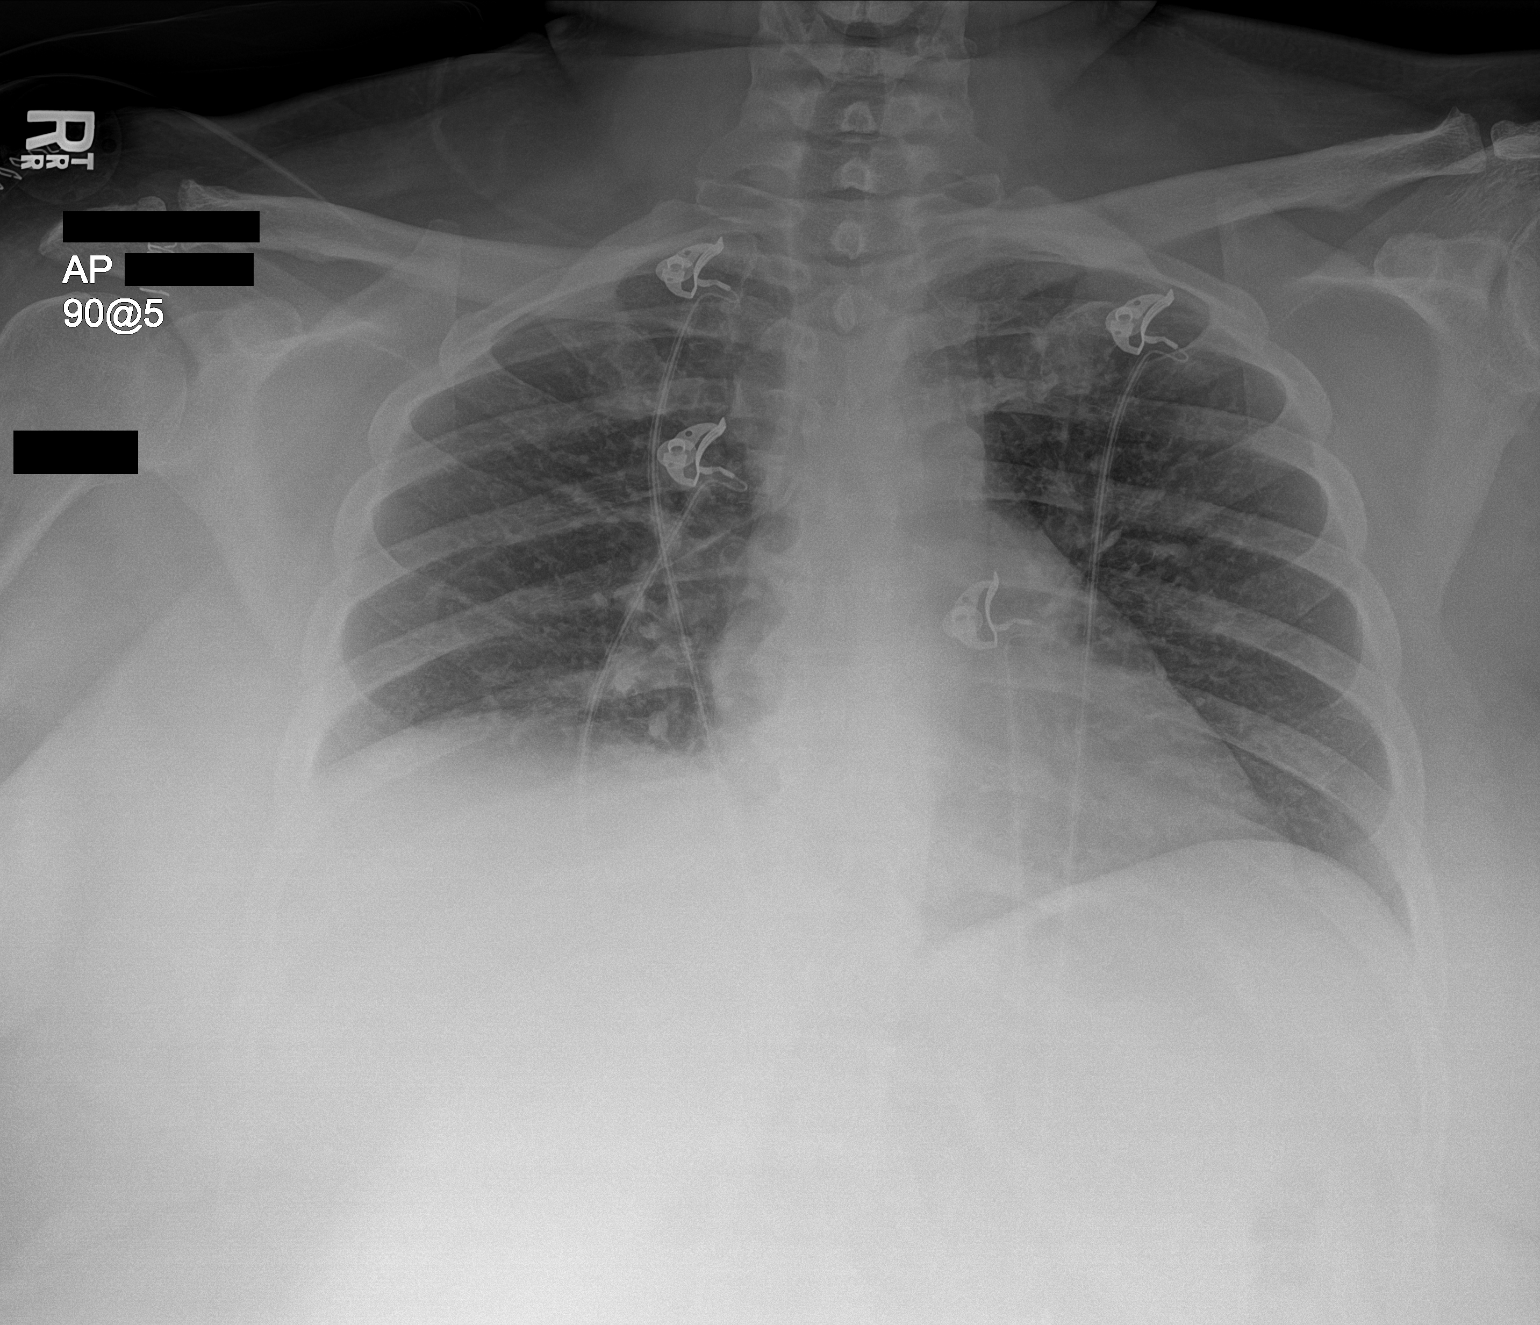

[2 of 2 positions shown; findings below may reference images not displayed]

FINDINGS: Shallow inspiration. Heart size and pulmonary vascularity are normal
for technique. No airspace disease or consolidation in the lungs. No
blunting of costophrenic angles. No pneumothorax. Mediastinal
contours appear intact. Surgical clips over the right shoulder
region. Degenerative changes in the spine.
IMPRESSION: No active disease.

## 2024-12-06 ENCOUNTER — Other Ambulatory Visit: Payer: Self-pay

## 2024-12-06 ENCOUNTER — Emergency Department (HOSPITAL_BASED_OUTPATIENT_CLINIC_OR_DEPARTMENT_OTHER)
Admission: EM | Admit: 2024-12-06 | Discharge: 2024-12-06 | Discharge status: Against Medical Advice | Attending: Physician Assistant | Admitting: Physician Assistant

## 2024-12-06 ENCOUNTER — Emergency Department (HOSPITAL_BASED_OUTPATIENT_CLINIC_OR_DEPARTMENT_OTHER): Admitting: Radiology

## 2024-12-06 ENCOUNTER — Encounter (HOSPITAL_BASED_OUTPATIENT_CLINIC_OR_DEPARTMENT_OTHER): Payer: Self-pay | Admitting: Emergency Medicine

## 2024-12-06 DIAGNOSIS — R0602 Shortness of breath: Secondary | ICD-10-CM | POA: Insufficient documentation

## 2024-12-06 DIAGNOSIS — Z5321 Procedure and treatment not carried out due to patient leaving prior to being seen by health care provider: Secondary | ICD-10-CM | POA: Insufficient documentation

## 2024-12-06 NOTE — ED Triage Notes (Signed)
 Reports [facility] since Monday. Recently dx with Flu B. NAD.
